# Patient Record
Sex: Male | Born: 1995 | Race: Black or African American | Hispanic: No | Marital: Single | Smoking: Never smoker
Health system: Southern US, Community
[De-identification: ages and names within clinical notes are randomized; demographics above are authoritative.]

## PROBLEM LIST (undated history)

## (undated) DIAGNOSIS — K259 Gastric ulcer, unspecified as acute or chronic, without hemorrhage or perforation: Secondary | ICD-10-CM

## (undated) DIAGNOSIS — R51 Headache: Secondary | ICD-10-CM

## (undated) DIAGNOSIS — F1910 Other psychoactive substance abuse, uncomplicated: Secondary | ICD-10-CM

## (undated) HISTORY — PX: CRANIOTOMY: SHX93

## (undated) HISTORY — DX: Headache: R51

---
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
## 2017-01-31 ENCOUNTER — Encounter: Payer: Self-pay | Admitting: Emergency Medicine

## 2017-06-17 IMAGING — US US RENAL
1 series · 14 of 25 positions shown · non-contrast
Comparison: CT of the abdomen and pelvis performed 06/29/2014

CLINICAL DATA: Acute onset of left flank pain for 2 days. Initial
encounter.

EXAM:
RENAL / URINARY TRACT ULTRASOUND COMPLETE

[Series 1: us renal · 0.21mm/px · 14 of 50 slices shown]
[im 1/50]
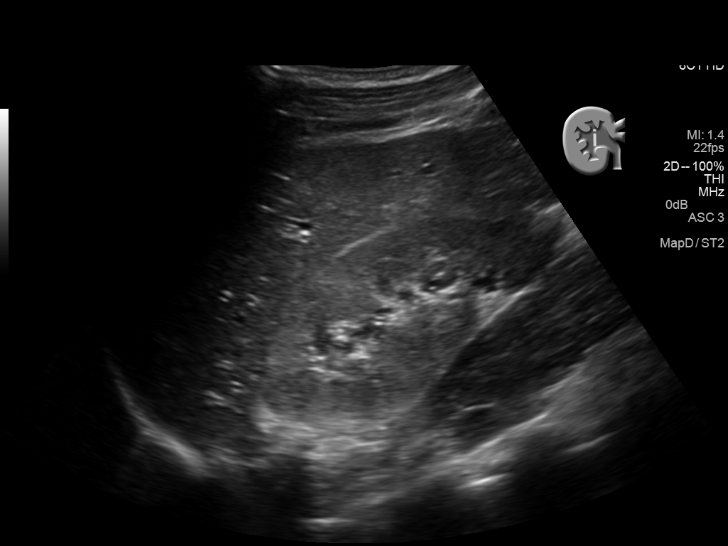
[im 5/50]
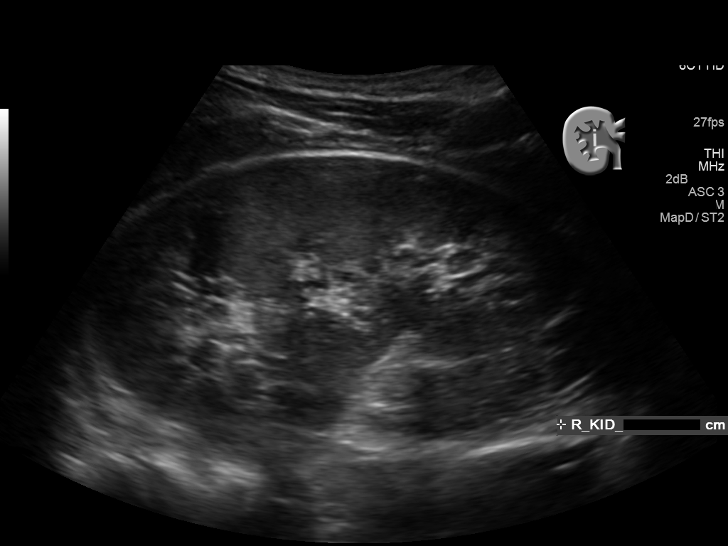
[im 9/50]
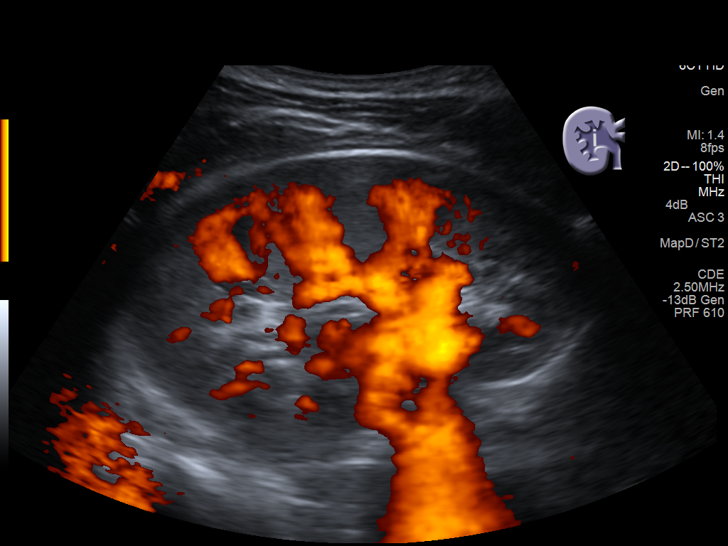
[im 13/50]
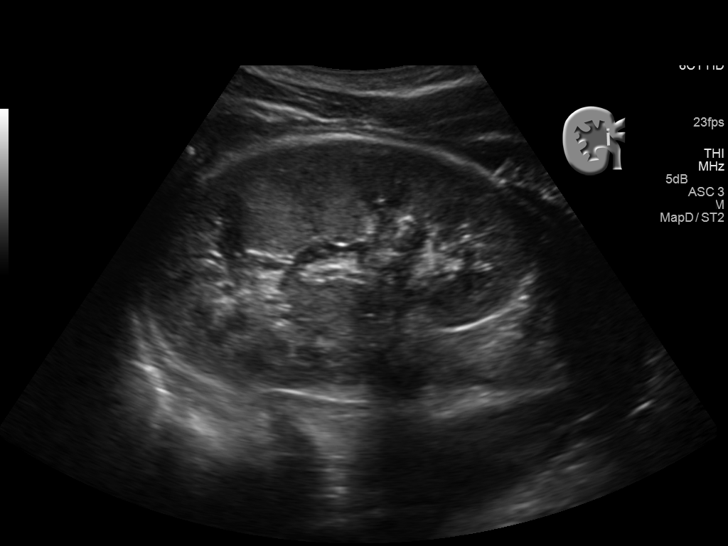
[im 17/50]
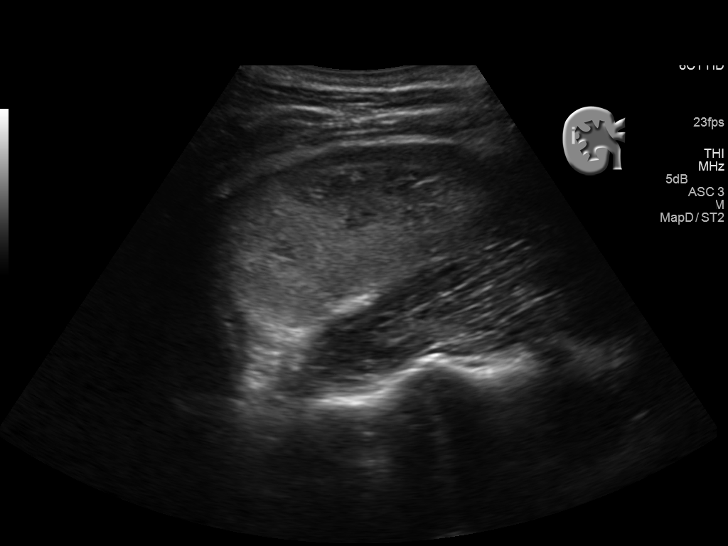
[im 19/50]
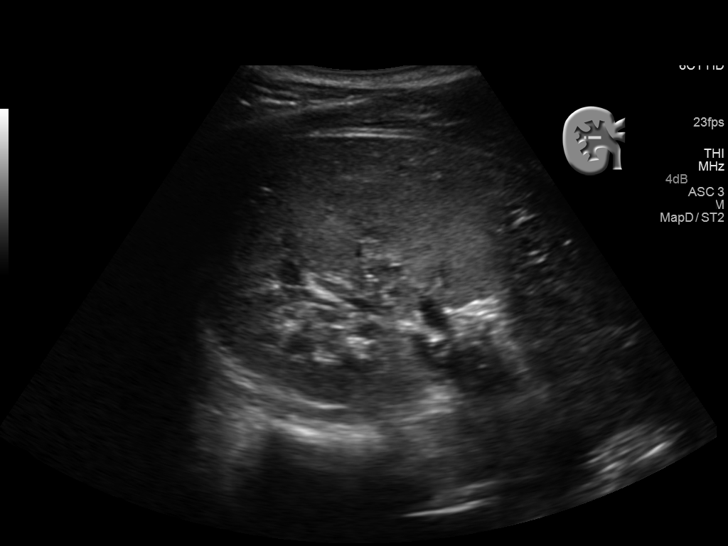
[im 23/50]
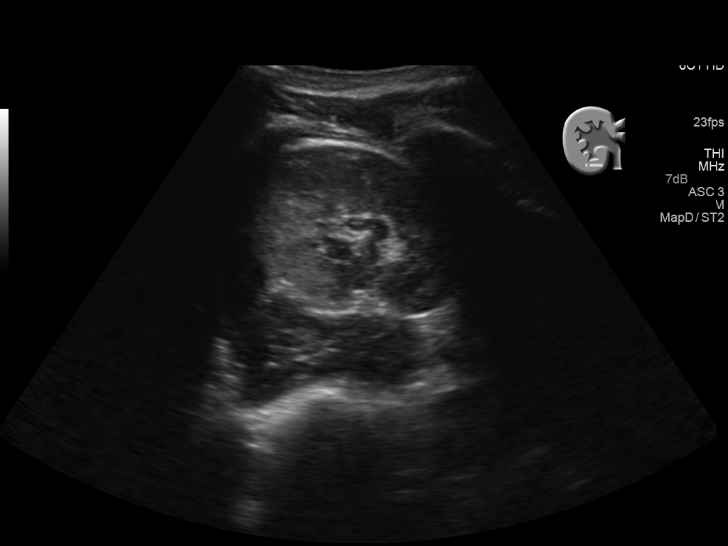
[im 27/50]
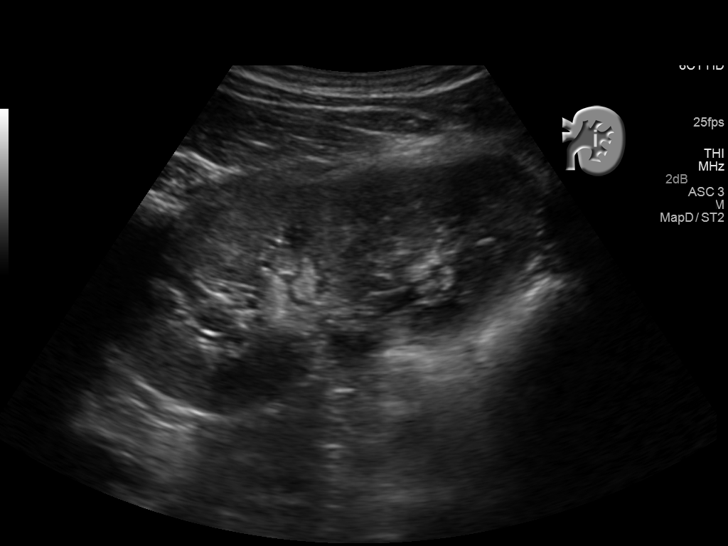
[im 31/50]
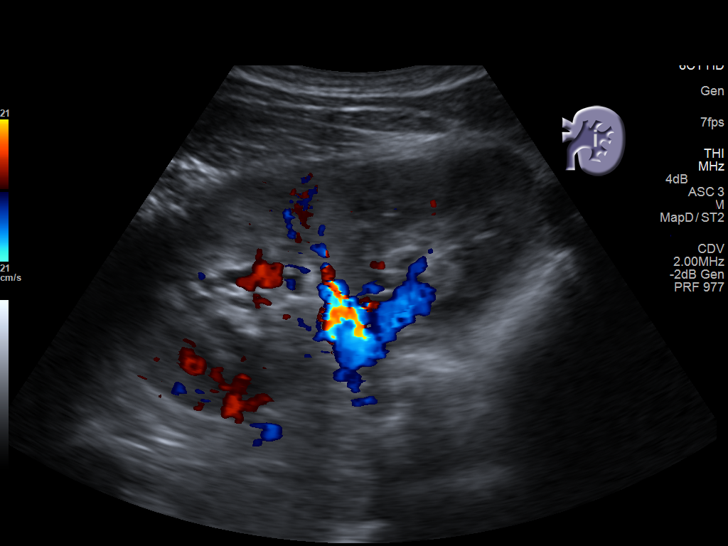
[im 33/50]
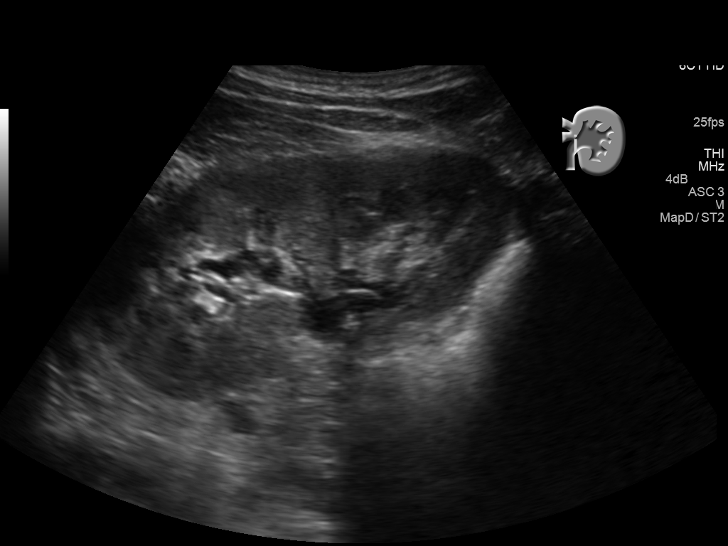
[im 37/50]
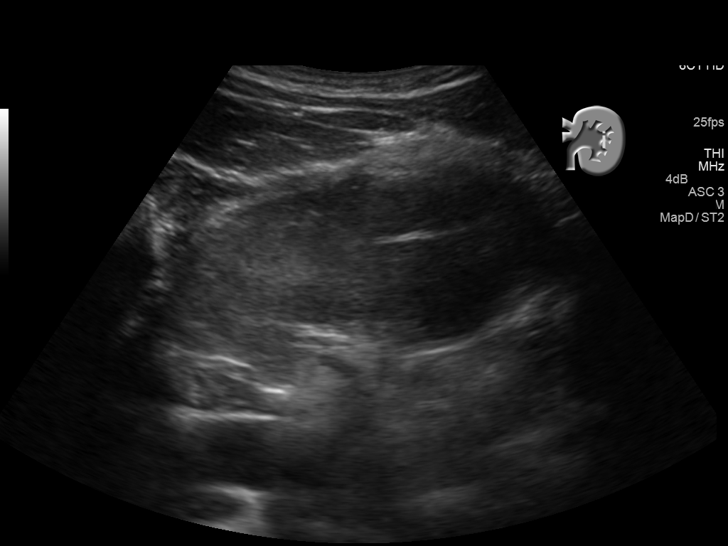
[im 41/50]
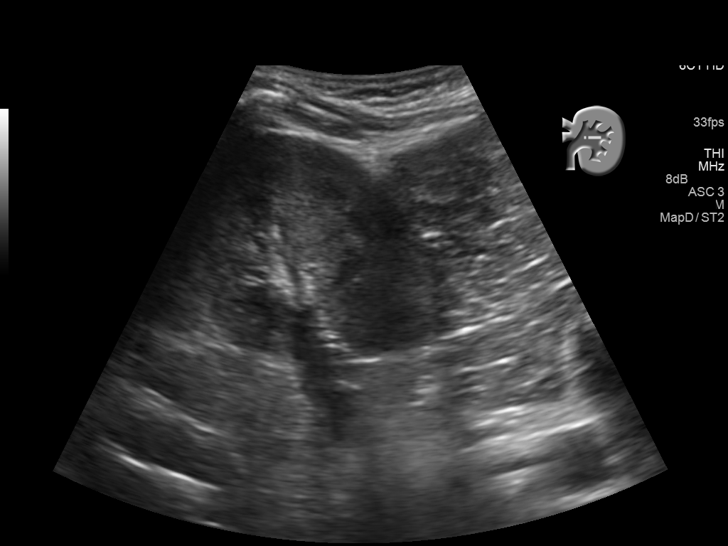
[im 45/50]
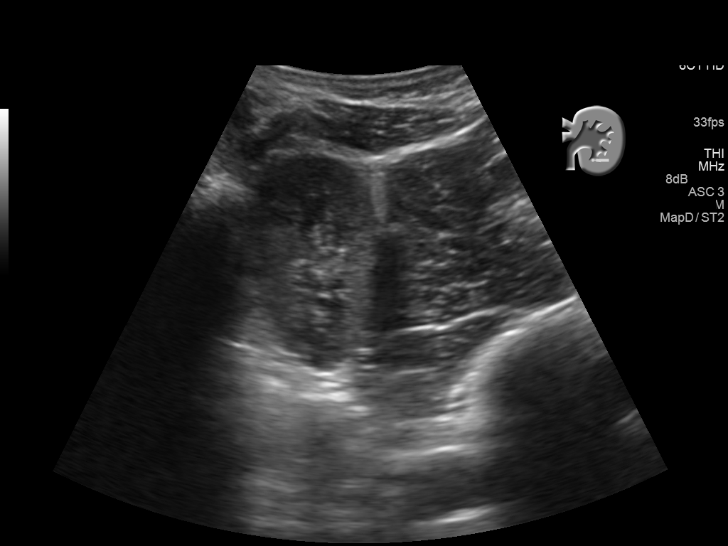
[im 50/50]
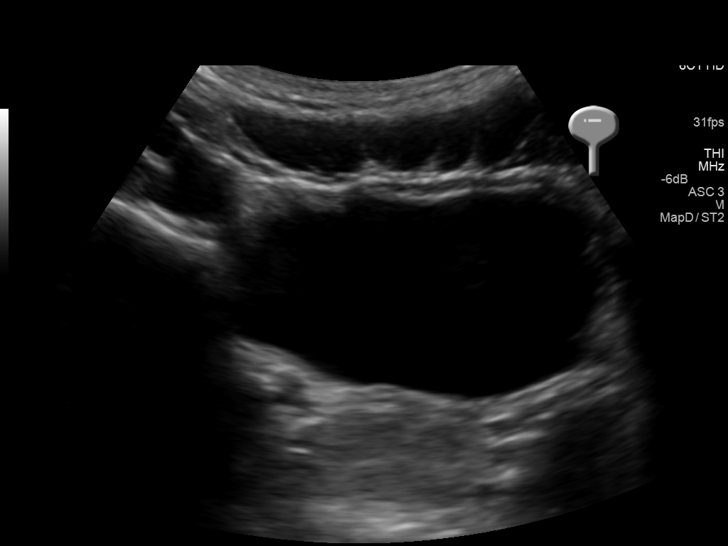

[14 of 25 positions shown; findings below may reference images not displayed]

FINDINGS: Right Kidney:

Length: 11.4 cm. Echogenicity within normal limits. No mass or
hydronephrosis visualized.

Left Kidney:

Length: 11.6 cm. Echogenicity within normal limits. No mass or
hydronephrosis visualized.

Bladder:

Mildly distended and grossly unremarkable in appearance.
IMPRESSION: Unremarkable renal ultrasound.  No evidence for hydronephrosis.

## 2017-08-06 ENCOUNTER — Emergency Department: Payer: Medicaid Other

## 2017-08-06 DIAGNOSIS — Y9367 Activity, basketball: Secondary | ICD-10-CM | POA: Insufficient documentation

## 2017-08-06 DIAGNOSIS — S93492A Sprain of other ligament of left ankle, initial encounter: Secondary | ICD-10-CM | POA: Diagnosis not present

## 2017-08-06 DIAGNOSIS — Y92310 Basketball court as the place of occurrence of the external cause: Secondary | ICD-10-CM | POA: Insufficient documentation

## 2017-08-06 DIAGNOSIS — Y998 Other external cause status: Secondary | ICD-10-CM | POA: Insufficient documentation

## 2017-08-06 DIAGNOSIS — Y33XXXA Other specified events, undetermined intent, initial encounter: Secondary | ICD-10-CM | POA: Insufficient documentation

## 2017-08-06 DIAGNOSIS — S99912A Unspecified injury of left ankle, initial encounter: Secondary | ICD-10-CM | POA: Diagnosis present

## 2017-08-06 NOTE — ED Triage Notes (Signed)
Patient reports "rolled" ankle while playing basketball yesterday and having continued pain today.

## 2017-08-07 ENCOUNTER — Emergency Department
Admission: EM | Admit: 2017-08-07 | Discharge: 2017-08-07 | Disposition: A | Discharge status: Home / Self Care | Payer: Medicaid Other | Attending: Emergency Medicine | Admitting: Emergency Medicine

## 2017-08-07 DIAGNOSIS — S93492A Sprain of other ligament of left ankle, initial encounter: Secondary | ICD-10-CM

## 2017-08-07 MED ORDER — PREDNISONE 10 MG PO TABS
10.0000 mg | ORAL_TABLET | Freq: Every day | ORAL | 0 refills | Status: DC
Start: 2017-08-07 — End: 2020-05-07

## 2017-08-07 NOTE — ED Provider Notes (Signed)
[facility] Emergency Department Provider Note  ____________________________________________  Time seen: Approximately 12:19 AM  I have reviewed the triage vital signs and the nursing notes.   HISTORY  Chief Complaint Ankle Pain    HPI Stephen Ford is a 21 y.o. male presents emergency department complaining of lateral ankle pain to the left ankle. Patient reports that he was playing basketball, went up for shot, landed awkwardly on his left ankle causing an inversion injury.patient is having pain to the lateral ankle with swelling. Patient has been using a friend's crutches for ambulation. No other injury or complaint. Patient has not taken any medications prior to arrival. Patient has a history of gastric ulcer and is unable to take NSAIDs.    Past Medical History:  Diagnosis Date  . Gastric ulcer   . Headache(784.0)   . Stomach ulcer     Patient Active Problem List   Diagnosis Date Noted  . Transient alteration of awareness 03/12/2013  . Migraine without aura, without mention of intractable migraine without mention of status migrainosus 03/12/2013  . Episodic tension type headache 03/12/2013  . Concussion with moderate (1-24 hours) loss of consciousness 03/12/2013    Past Surgical History:  Procedure Laterality Date  . CRANIOTOMY      Prior to Admission medications   Medication Sig Start Date End Date Taking? Authorizing Provider  cyclobenzaprine (FLEXERIL) 10 MG tablet Take 1 tablet (10 mg total) by mouth 3 (three) times daily as needed. 08/26/16   Joni Reining, PA-C  fluticasone (FLONASE) 50 MCG/ACT nasal spray Place 2 sprays into both nostrils daily. 03/28/16   Joselyn Arrow, NP  predniSONE (DELTASONE) 10 MG tablet Take 1 tablet (10 mg total) by mouth daily. 08/07/17   Cuthriell, Delorise Royals, PA-C  traMADol (ULTRAM) 50 MG tablet Take 1 tablet (50 mg total) by mouth every 12 (twelve) hours as needed. 08/26/16   Joni Reining, PA-C     Allergies Patient has no known allergies.  Family History  Problem Relation Age of Onset  . Other Other        Maternal Great Aunt had Down's Syndrome  . Blindness Other        Maternal Great Grandmother    Social History Social History  Substance Use Topics  . Smoking status: Never Smoker  . Smokeless tobacco: Never Used  . Alcohol use No     Review of Systems  Constitutional: No fever/chills Cardiovascular: no chest pain. Respiratory: no cough. No SOB. Gastrointestinal: No abdominal pain.  No nausea, no vomiting.   Musculoskeletal: positive for left ankle injury Skin: Negative for rash, abrasions, lacerations, ecchymosis. Neurological: Negative for headaches, focal weakness or numbness. 10-point ROS otherwise negative.  ____________________________________________   PHYSICAL EXAM:  VITAL SIGNS: ED Triage Vitals  Enc Vitals Group     BP 08/06/17 2337 (!) 143/75     Pulse Rate 08/06/17 2337 83     Resp 08/06/17 2337 20     Temp 08/06/17 2337 98.4 F (36.9 C)     Temp Source 08/06/17 2337 Oral     SpO2 08/06/17 2337 100 %     Weight 08/06/17 2315 205 lb (93 kg)     Height 08/06/17 2315  (1.854 m)     [facility] 08/06/17 2315 9     Pain Loc --      Pain Edu? --  Excl. in GC? --      Constitutional: Alert and oriented. Well appearing and in no acute distress. Eyes: Conjunctivae are normal. PERRL. EOMI. Head: Atraumatic. Neck: No stridor.    Cardiovascular: Normal rate, regular rhythm. Normal S1 and S2.  Good peripheral circulation. Respiratory: Normal respiratory effort without tachypnea or retractions. Lungs CTAB. Good air entry to the bases with no decreased or absent breath sounds. Musculoskeletal: Full range of motion to all extremities. No gross deformities appreciated.patient has edema noted to the lateral aspect of the left ankle. No deformity. Full range of motion with coaxing. Patient is  tender to palpation over the lateral malleolus and the anterior talofibular ligament distribution. No palpable abnormality. Dorsalis pedis pulse intact. Sensation intact all 5 digits. Examination of the left knee is unremarkable. Neurologic:  Normal speech and language. No gross focal neurologic deficits are appreciated.  Skin:  Skin is warm, dry and intact. No rash noted. Psychiatric: Mood and affect are normal. Speech and behavior are normal. Patient exhibits appropriate insight and judgement.   ____________________________________________   LABS (all labs ordered are listed, but only abnormal results are displayed)  Labs Reviewed - No data to display ____________________________________________  EKG   ____________________________________________  RADIOLOGY Festus Barren Cuthriell, personally viewed and evaluated these images (plain radiographs) as part of my medical decision making, as well as reviewing the written report by the radiologist.  Dg Ankle Complete Left  Result Date: 08/06/2017 CLINICAL DATA:  Basketball injury yesterday with continued pain today. EXAM: LEFT ANKLE COMPLETE - 3+ VIEW COMPARISON:  None. FINDINGS: Lateral malleolar soft tissue swelling. No evidence of acute fracture. Old unfused fragment at the tip of the medial malleolus. Mortise is symmetric. IMPRESSION: Negative for acute fracture Electronically Signed   By: Ellery Plunk M.D.   On: 08/06/2017 23:34    ____________________________________________    PROCEDURES  Procedure(s) performed:    Procedures    Medications - No data to display   ____________________________________________   INITIAL IMPRESSION / ASSESSMENT AND PLAN / ED COURSE  Pertinent labs & imaging results that were available during my care of the patient were reviewed by me and considered in my medical decision making (see chart for details).  Review of the [place] CSRS was performed in accordance of the [facility] prior to  dispensing any controlled drugs.     Patient's diagnosis is consistent with left ankle sprain. Differential included sprain versus fracture versus ligament rupture. Exam is most consistent with ankle sprain is patient has a negative x-ray for acute osseous abnormality. No indication of acute ligamentous rupture.. Patient will be discharged home with prescriptions for prednisone taper as patient is unable to take NSAIDs. Patient is to follow up with family care or orthopedics as needed or otherwise directed. Patient is given ED precautions to return to the ED for any worsening or new symptoms.     ____________________________________________  FINAL CLINICAL IMPRESSION(S) / ED DIAGNOSES  Final diagnoses:  Sprain of anterior talofibular ligament of left ankle, initial encounter      NEW MEDICATIONS STARTED DURING THIS VISIT:  New Prescriptions   PREDNISONE (DELTASONE) 10 MG TABLET    Take 1 tablet (10 mg total) by mouth daily.        This chart was dictated using voice recognition software/Dragon. Despite best efforts to proofread, errors can occur which can change the meaning. Any change was purely unintentional.    Racheal Patches, PA-C 08/07/17 0028    Jeanmarie Plant, MD 08/07/17  [id number]  

## 2020-05-07 ENCOUNTER — Emergency Department
Admission: EM | Admit: 2020-05-07 | Discharge: 2020-05-07 | Disposition: A | Discharge status: Home / Self Care | Payer: Self-pay | Attending: Emergency Medicine | Admitting: Emergency Medicine

## 2020-05-07 ENCOUNTER — Encounter: Payer: Self-pay | Admitting: Emergency Medicine

## 2020-05-07 ENCOUNTER — Other Ambulatory Visit: Payer: Self-pay

## 2020-05-07 ENCOUNTER — Emergency Department: Payer: Self-pay

## 2020-05-07 DIAGNOSIS — Y92310 Basketball court as the place of occurrence of the external cause: Secondary | ICD-10-CM | POA: Insufficient documentation

## 2020-05-07 DIAGNOSIS — S20212A Contusion of left front wall of thorax, initial encounter: Secondary | ICD-10-CM | POA: Insufficient documentation

## 2020-05-07 DIAGNOSIS — Y9367 Activity, basketball: Secondary | ICD-10-CM | POA: Insufficient documentation

## 2020-05-07 DIAGNOSIS — Y998 Other external cause status: Secondary | ICD-10-CM | POA: Insufficient documentation

## 2020-05-07 DIAGNOSIS — W500XXA Accidental hit or strike by another person, initial encounter: Secondary | ICD-10-CM | POA: Insufficient documentation

## 2020-05-07 MED ORDER — MELOXICAM 7.5 MG PO TABS
15.0000 mg | ORAL_TABLET | Freq: Once | ORAL | Status: AC
  Administered 2020-05-07: 15 mg via ORAL
  Filled 2020-05-07: qty 2

## 2020-05-07 MED ORDER — MELOXICAM 15 MG PO TABS: 15.0000 mg | ORAL_TABLET | Freq: Every day | ORAL | 0 refills | Status: DC

## 2020-05-07 NOTE — ED Notes (Signed)
See triage note  Presents with pain to left lateral rib area  States he was kneed to left rib rib area couple of days ago while playing b/b

## 2020-05-07 NOTE — ED Triage Notes (Signed)
Patient reports playing basketball 2 days ago and getting hit in the left side with a knee and now having worsening pain in left ribs.

## 2020-05-07 NOTE — ED Provider Notes (Signed)
[facility] Emergency Department Provider Note  ____________________________________________  Time seen: Approximately 3:17 PM  I have reviewed the triage vital signs and the nursing notes.   HISTORY  Chief Complaint Rib Injury    HPI Stephen Ford is a 24 y.o. male who presents the emergency department complaining of left rib pain.  Patient states he was playing basketball a couple of days ago, jumped up for a rebound when another player that same.  Patient states that the other players knee made contact with his left ribs.  Patient has had left rib pain since.  Patient denies any shortness of breath or chest pain.  No abdominal pain.  No nausea or vomiting.  No other complaints or injuries at this time.         Past Medical History:  Diagnosis Date  . Gastric ulcer   . Headache(784.0)   . Stomach ulcer     Patient Active Problem List   Diagnosis Date Noted  . Transient alteration of awareness 03/12/2013  . Migraine without aura, without mention of intractable migraine without mention of status migrainosus 03/12/2013  . Episodic tension type headache 03/12/2013  . Concussion with moderate (1-24 hours) loss of consciousness 03/12/2013    Past Surgical History:  Procedure Laterality Date  . CRANIOTOMY      Prior to Admission medications   Medication Sig Start Date End Date Taking? Authorizing Provider  meloxicam (MOBIC) 15 MG tablet Take 1 tablet (15 mg total) by mouth daily. 05/07/20   Tinzley Dalia, Delorise Royals, PA-C    Allergies Patient has no known allergies.  Family History  Problem Relation Age of Onset  . Other Other        Maternal Great Aunt had Down's Syndrome  . Blindness Other        Maternal Great Grandmother    Social History Social History   Tobacco Use  . Smoking status: Never Smoker  . Smokeless tobacco: Never Used  Substance Use Topics  . Alcohol use: No  . Drug use: No     Review of Systems  Constitutional: No  fever/chills Eyes: No visual changes. No discharge ENT: No upper respiratory complaints. Cardiovascular: no chest pain. Respiratory: no cough. No SOB. Gastrointestinal: No abdominal pain.  No nausea, no vomiting.  No diarrhea.  No constipation. Musculoskeletal: Left rib pain Skin: Negative for rash, abrasions, lacerations, ecchymosis. Neurological: Negative for headaches, focal weakness or numbness. 10-point ROS otherwise negative.  ____________________________________________   PHYSICAL EXAM:  VITAL SIGNS: ED Triage Vitals  Enc Vitals Group     BP 05/07/20 1236 116/74     Pulse Rate 05/07/20 1236 76     Resp 05/07/20 1236 20     Temp 05/07/20 1236 98.7 F (37.1 C)     Temp Source 05/07/20 1236 Oral     SpO2 05/07/20 1236 97 %     Weight 05/07/20 1219 205 lb 0.4 oz (93 kg)     Height 05/07/20 1219 6\' 1"  (1.854 m)     Head Circumference --      Peak Flow --      Pain Score 05/07/20 1219 7     Pain Loc --      Pain Edu? --      Excl. in GC? --      Constitutional: Alert and oriented. Well appearing and in no acute distress. Eyes: Conjunctivae are normal. PERRL. EOMI. Head: Atraumatic. ENT:      Ears:  Nose: No congestion/rhinnorhea.      Mouth/Throat: Mucous membranes are moist.  Neck: No stridor.    Cardiovascular: Normal rate, regular rhythm. Normal S1 and S2.  Good peripheral circulation. Respiratory: Normal respiratory effort without tachypnea or retractions. Lungs CTAB. Good air entry to the bases with no decreased or absent breath sounds. Gastrointestinal: Bowel sounds 4 quadrants. Soft and nontender to palpation. No guarding or rigidity. No palpable masses. No distention. No CVA enderness. Musculoskeletal: Full range of motion to all extremities. No gross deformities appreciated.  Visualization of the left ribs reveals no deformity, paradoxical chest wall movement, abrasions or lacerations.  Patient is tender to palpation over ribs 10 through 12 left lower  lateral ribs.  No palpable abnormality.  No crepitus.  No subcutaneous emphysema.  Good underlying breath sounds bilaterally.  No other tenderness to palpation. Neurologic:  Normal speech and language. No gross focal neurologic deficits are appreciated.  Skin:  Skin is warm, dry and intact. No rash noted. Psychiatric: Mood and affect are normal. Speech and behavior are normal. Patient exhibits appropriate insight and judgement.   ____________________________________________   LABS (all labs ordered are listed, but only abnormal results are displayed)  Labs Reviewed - No data to display ____________________________________________  EKG   ____________________________________________  RADIOLOGY I personally viewed and evaluated these images as part of my medical decision making, as well as reviewing the written report by the radiologist.  DG Ribs Unilateral W/Chest Left  Result Date: 05/07/2020 CLINICAL DATA:  Hit in left side 2 days ago playing basketball. Left rib pain. EXAM: LEFT RIBS AND CHEST - 3+ VIEW COMPARISON:  None. FINDINGS: No fracture or other bone lesions are seen involving the ribs. There is no evidence of pneumothorax or pleural effusion. Both lungs are clear. Heart size and mediastinal contours are within normal limits. IMPRESSION: Negative. Electronically Signed   By: Charlett Nose M.D.   On: 05/07/2020 12:47    ____________________________________________    PROCEDURES  Procedure(s) performed:    Procedures    Medications  meloxicam (MOBIC) tablet 15 mg (15 mg Oral Given 05/07/20 1527)     ____________________________________________   INITIAL IMPRESSION / ASSESSMENT AND PLAN / ED COURSE  Pertinent labs & imaging results that were available during my care of the patient were reviewed by me and considered in my medical decision making (see chart for details).  Review of the [place] CSRS was performed in accordance of the [facility] prior to dispensing any  controlled drugs.           Patient's diagnosis is consistent with rib contusion.  Patient presented to emergency department after being accidentally kneed in the chest while playing basketball.  Patient is having ongoing pain to the left ribs.  No difficulty breathing..  Imaging reveals no acute rib fractures or pneumothorax.  Differential included rib contusion, fractured rib, pneumothorax.  Meloxicam for symptom improvement.  Follow-up primary care as needed.  Patient is given ED precautions to return to the ED for any worsening or new symptoms.     ____________________________________________  FINAL CLINICAL IMPRESSION(S) / ED DIAGNOSES  Final diagnoses:  Contusion of rib on left side, initial encounter      NEW MEDICATIONS STARTED DURING THIS VISIT:  ED Discharge Orders         Ordered    meloxicam (MOBIC) 15 MG tablet  Daily     Discontinue  Reprint     05/07/20 1520  This chart was dictated using voice recognition software/Dragon. Despite best efforts to proofread, errors can occur which can change the meaning. Any change was purely unintentional.    Racheal Patches, PA-C 05/07/20 1529    Dionne Bucy, MD 05/07/20 1539

## 2021-05-30 ENCOUNTER — Emergency Department
Admission: EM | Admit: 2021-05-30 | Discharge: 2021-05-30 | Disposition: A | Discharge status: Home / Self Care | Payer: Self-pay | Attending: Emergency Medicine | Admitting: Emergency Medicine

## 2021-05-30 ENCOUNTER — Emergency Department: Payer: Self-pay

## 2021-05-30 ENCOUNTER — Other Ambulatory Visit: Payer: Self-pay

## 2021-05-30 DIAGNOSIS — J209 Acute bronchitis, unspecified: Secondary | ICD-10-CM | POA: Insufficient documentation

## 2021-05-30 DIAGNOSIS — Z20822 Contact with and (suspected) exposure to covid-19: Secondary | ICD-10-CM | POA: Insufficient documentation

## 2021-05-30 DIAGNOSIS — Z28310 Unvaccinated for covid-19: Secondary | ICD-10-CM | POA: Insufficient documentation

## 2021-05-30 LAB — RESP PANEL BY RT-PCR (FLU A&B, COVID) ARPGX2
Influenza A by PCR: NEGATIVE
Influenza B by PCR: NEGATIVE
SARS Coronavirus 2 by RT PCR: NEGATIVE

## 2021-05-30 MED ORDER — AZITHROMYCIN 250 MG PO TABS: ORAL_TABLET | ORAL | 0 refills | Status: DC

## 2021-05-30 MED ORDER — BENZONATATE 100 MG PO CAPS: 100.0000 mg | ORAL_CAPSULE | Freq: Three times a day (TID) | ORAL | 0 refills | Status: AC | PRN

## 2021-05-30 NOTE — Discharge Instructions (Addendum)
Follow-up with your regular doctor if not improving in 2 to 3 days.  Return emergency department for worsening Take medications as prescribed.  Also take over-the-counter Mucinex

## 2021-05-30 NOTE — ED Triage Notes (Signed)
Pt states that he has had a cough, congestion, sore throat and felt like he's had a cold x1 week- pt states that he has been coughing up mucous and this morning noticed some blood in the mucous

## 2021-05-30 NOTE — ED Notes (Signed)
Pt presents to the ED for weakness, fatigue, and productive cough for the past 2 weeks. Unknown if he has had a fever. Has not tested himself for COVID. Pt states he does have some SOB on exertion. Pt states he noticed some blood in his sputum last night. Pt is A&Ox4 and NAD.

## 2021-05-30 NOTE — ED Provider Notes (Signed)
[facility] Emergency Department Provider Note  ____________________________________________   Event Date/Time   First MD Initiated Contact with Patient 05/30/21 1122     (approximate)  I have reviewed the triage vital signs and the nursing notes.   HISTORY  Chief Complaint Cough    HPI Stephen Ford is a 25 y.o. male presents emergency department complaining of a cough for 2 weeks.  Patient denies fever or chills.  States cough is productive and today had some red streaks of blood in the mucus.  He denies chest pain or shortness of breath.  Patient was not vaccinated for COVID.  No recent COVID exposure that he knows of.  Past Medical History:  Diagnosis Date   Gastric ulcer    Headache(784.0)    Stomach ulcer     Patient Active Problem List   Diagnosis Date Noted   Transient alteration of awareness 03/12/2013   Migraine without aura, without mention of intractable migraine without mention of status migrainosus 03/12/2013   Episodic tension type headache 03/12/2013   Concussion with moderate (1-24 hours) loss of consciousness 03/12/2013    Past Surgical History:  Procedure Laterality Date   CRANIOTOMY      Prior to Admission medications   Medication Sig Start Date End Date Taking? Authorizing Provider  azithromycin (ZITHROMAX Z-PAK) 250 MG tablet 2 pills today then 1 pill a day for 4 days 05/30/21  Yes Taeler Winning, Roselyn Bering, PA-C  benzonatate (TESSALON PERLES) 100 MG capsule Take 1 capsule (100 mg total) by mouth 3 (three) times daily as needed for cough. 05/30/21 05/30/22 Yes Anelia Carriveau, Roselyn Bering, PA-C  meloxicam (MOBIC) 15 MG tablet Take 1 tablet (15 mg total) by mouth daily. 05/07/20   Cuthriell, Delorise Royals, PA-C    Allergies Patient has no known allergies.  Family History  Problem Relation Age of Onset   Other Other        Maternal Great Aunt had Down's Syndrome   Blindness Other        Maternal Programme researcher, broadcasting/film/video    Social History Social  History   Tobacco Use   Smoking status: Never   Smokeless tobacco: Never  Substance Use Topics   Alcohol use: No   Drug use: No    Review of Systems  Constitutional: No fever/chills Eyes: No visual changes. ENT: No sore throat. Respiratory: Positive cough Cardiovascular: Denies chest pain Gastrointestinal: Denies abdominal pain Genitourinary: Negative for dysuria. Musculoskeletal: Negative for back pain. Skin: Negative for rash. Psychiatric: no mood changes,     ____________________________________________   PHYSICAL EXAM:  VITAL SIGNS: ED Triage Vitals  Enc Vitals Group     BP 05/30/21 1023 118/63     Pulse Rate 05/30/21 1023 62     Resp 05/30/21 1023 18     Temp 05/30/21 1023 98.1 F (36.7 C)     Temp Source 05/30/21 1023 Oral     SpO2 05/30/21 1023 94 %     Weight 05/30/21 1020 177 lb (80.3 kg)     Height 05/30/21 1020 6\' 1"  (1.854 m)     Head Circumference --      Peak Flow --      Pain Score 05/30/21 1020 5     Pain Loc --      Pain Edu? --      Excl. in GC? --     Constitutional: Alert and oriented. Well appearing and in no acute distress. Eyes: Conjunctivae are normal.  Head: Atraumatic. Nose:  No congestion/rhinnorhea. Mouth/Throat: Mucous membranes are moist.   Neck:  supple no lymphadenopathy noted Cardiovascular: Normal rate, regular rhythm. Heart sounds are normal Respiratory: Normal respiratory effort.  No retractions, lungs c t a  GU: deferred Musculoskeletal: FROM all extremities, warm and well perfused Neurologic:  Normal speech and language.  Skin:  Skin is warm, dry and intact. No rash noted. Psychiatric: Mood and affect are normal. Speech and behavior are normal.  ____________________________________________   LABS (all labs ordered are listed, but only abnormal results are displayed)  Labs Reviewed  RESP PANEL BY RT-PCR (FLU A&B, COVID) [id number]    ____________________________________________   ____________________________________________  RADIOLOGY  Chest x-ray  ____________________________________________   PROCEDURES  Procedure(s) performed: No  Procedures    ____________________________________________   INITIAL IMPRESSION / ASSESSMENT AND PLAN / ED COURSE  Pertinent labs & imaging results that were available during my care of the patient were reviewed by me and considered in my medical decision making (see chart for details).   Patient is a 25 year old male presents emergency department with URI symptoms.  See HPI.  Physical exam shows patient per stable.  Chest x-ray reviewed by me confirmed by radiology to be negative for any acute abnormality  Covid/flu test is negative  I did explain the findings to the patient.  He is to follow-up with his regular doctor if not improving in 2 to 3 days.  Return emergency department worsening.  Scalp prescription for Z-Pak and Tessalon Perles.  Take over-the-counter Mucinex.  Was given a work note and discharged stable condition.     Stephen Ford was evaluated in Emergency Department on 05/30/2021 for the symptoms described in the history of present illness. He was evaluated in the context of the global COVID-19 pandemic, which necessitated consideration that the patient might be at risk for infection with the SARS-CoV-2 virus that causes COVID-19. Institutional protocols and algorithms that pertain to the evaluation of patients at risk for COVID-19 are in a state of rapid change based on information released by regulatory bodies including the CDC and federal and state organizations. These policies and algorithms were followed during the patient's care in the ED.    As part of my medical decision making, I reviewed the following data within the electronic MEDICAL RECORD NUMBER Nursing notes reviewed and incorporated, Labs reviewed , Old chart reviewed, Radiograph reviewed , Notes  from prior ED visits, and [place] Controlled Substance Database  ____________________________________________   FINAL CLINICAL IMPRESSION(S) / ED DIAGNOSES  Final diagnoses:  Acute bronchitis, unspecified organism      NEW MEDICATIONS STARTED DURING THIS VISIT:  New Prescriptions   AZITHROMYCIN (ZITHROMAX Z-PAK) 250 MG TABLET    2 pills today then 1 pill a day for 4 days   BENZONATATE (TESSALON PERLES) 100 MG CAPSULE    Take 1 capsule (100 mg total) by mouth 3 (three) times daily as needed for cough.     Note:  This document was prepared using Dragon voice recognition software and may include unintentional dictation errors.    Faythe Ghee, PA-C 05/30/21 1310    Georga Hacking, MD 05/30/21 1344

## 2021-12-25 ENCOUNTER — Emergency Department
Admission: EM | Admit: 2021-12-25 | Discharge: 2021-12-25 | Disposition: A | Discharge status: Home / Self Care | Payer: Self-pay | Attending: Emergency Medicine | Admitting: Emergency Medicine

## 2021-12-25 ENCOUNTER — Emergency Department: Payer: Self-pay

## 2021-12-25 ENCOUNTER — Other Ambulatory Visit: Payer: Self-pay

## 2021-12-25 DIAGNOSIS — R0600 Dyspnea, unspecified: Secondary | ICD-10-CM | POA: Insufficient documentation

## 2021-12-25 DIAGNOSIS — R55 Syncope and collapse: Secondary | ICD-10-CM | POA: Insufficient documentation

## 2021-12-25 DIAGNOSIS — R42 Dizziness and giddiness: Secondary | ICD-10-CM | POA: Insufficient documentation

## 2021-12-25 LAB — URINALYSIS, ROUTINE W REFLEX MICROSCOPIC
Bilirubin Urine: NEGATIVE
Glucose, UA: NEGATIVE mg/dL
Hgb urine dipstick: NEGATIVE
Ketones, ur: NEGATIVE mg/dL
Leukocytes,Ua: NEGATIVE
Nitrite: NEGATIVE
Protein, ur: NEGATIVE mg/dL
Specific Gravity, Urine: >1.046 — ABNORMAL HIGH (ref 1.005–1.030)
pH: 6 (ref 5.0–8.0)

## 2021-12-25 LAB — BASIC METABOLIC PANEL
Anion gap: 9 (ref 5–15)
BUN: 11 mg/dL (ref 6–20)
CO2: 24 mmol/L (ref 22–32)
Calcium: 9.8 mg/dL (ref 8.9–10.3)
Chloride: 106 mmol/L (ref 98–111)
Creatinine, Ser: 0.96 mg/dL (ref 0.61–1.24)
GFR, Estimated: >60 mL/min (ref >60)
Glucose, Bld: 101 mg/dL — ABNORMAL HIGH (ref 70–99)
Potassium: 4.2 mmol/L (ref 3.5–5.1)
Sodium: 139 mmol/L (ref 135–145)

## 2021-12-25 LAB — CBC
HCT: 43.1 % (ref 39.0–52.0)
Hemoglobin: 14.1 g/dL (ref 13.0–17.0)
MCH: 25.3 pg — ABNORMAL LOW (ref 26.0–34.0)
MCHC: 32.7 g/dL (ref 30.0–36.0)
MCV: 77.4 fL — ABNORMAL LOW (ref 80.0–100.0)
Platelets: 348 10*3/uL (ref 150–400)
RBC: 5.57 MIL/uL (ref 4.22–5.81)
RDW: 14.6 % (ref 11.5–15.5)
WBC: 3.8 10*3/uL — ABNORMAL LOW (ref 4.0–10.5)
nRBC: 0 % (ref 0.0–0.2)

## 2021-12-25 MED ORDER — KETOROLAC TROMETHAMINE 30 MG/ML IJ SOLN
15.0000 mg | INTRAMUSCULAR | Status: AC
Start: 2021-12-25 — End: 2021-12-25
  Administered 2021-12-25: 15 mg via INTRAVENOUS
  Filled 2021-12-25: qty 1

## 2021-12-25 MED ORDER — IOHEXOL 350 MG/ML SOLN
75.0000 mL | Freq: Once | INTRAVENOUS | Status: AC | PRN
  Administered 2021-12-25: 75 mL via INTRAVENOUS

## 2021-12-25 NOTE — ED Triage Notes (Addendum)
Patient to ER via Pov with friends. Patient reports he woke up this morning to get ready around 12:50, states the next thing he remembers was waking up on the floor on his stomach. Unsure if he hit his head but reports his neck hurts. Reports he woke up around 1315 and texted his friend. States he still feels dizzy, has a headache and is having difficulty thinking. Reports for several weeks he has had difficulty focusing and thinking and had a poor appetite. ? ?No history of the same. Reports having the flu two weeks ago.  ? ?Denies blood thinner usage.  ?
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
## 2021-12-25 NOTE — ED Provider Notes (Signed)
? ?[facility] ?Provider Note ? ? ? Event Date/Time  ? First MD Initiated Contact with Patient 12/25/21 1957   ?  (approximate) ? ? ?History  ? ?Headache and Loss of Consciousness ? ? ?HPI ? ?Stephen Ford is a 26 y.o. male with no significant past medical history who comes ED due to syncope today.  He reports being in his usual state of health, got up this morning, and then subsequently passed out while walking to the bathroom.  He notes that his girlfriend told him that he had seemed like he was stumbling prior to passing out, and there is a dent in the wall where he fell against it.  He is not having any preceding headache vision change paresthesias weakness chest pain shortness of breath or abdominal pain.  No chest pain or shortness of breath afterward, but he does have some headache from where he believes he hit the wall. ? ?No history of syncope.  He does report intermittent episodes of lightheadedness.  He also gets dyspnea on exertion which he attributes to vape use. ?  ? ? ?Physical Exam  ? ?Triage Vital Signs: ?ED Triage Vitals [12/25/21 1458]  ?Enc Vitals Group  ?   BP 120/84  ?   Pulse Rate 90  ?   Resp 16  ?   Temp 98.6 ?F (37 ?C)  ?   Temp Source Oral  ?   SpO2 99 %  ?   Weight 164 lb (74.4 kg)  ?   Height 6\' 1"  (1.854 m)  ?   Head Circumference   ?   Peak Flow   ?   Pain Score 6  ?   Pain Loc   ?   Pain Edu?   ?   Excl. in GC?   ? ? ?Most recent vital signs: ?Vitals:  ? 12/25/21 1829 12/25/21 1957  ?BP: 122/79 116/70  ?Pulse: 79 76  ?Resp: 16 18  ?Temp:    ?SpO2: 98% 100%  ? ? ? ?General: Awake, no distress.  ?CV:  Good peripheral perfusion.  No murmurs.  Regular rate and rhythm.  Normal peripheral pulses. ?Resp:  Normal effort.  Clear to auscultation bilaterally ?Abd:  No distention.  ?Other:  No lower extremity edema.  Head is atraumatic, no swelling or point tenderness, no wounds.  No midline spinal tenderness.  Full range of motion of the neck.  He is ambulatory. ? ? ?ED  Results / Procedures / Treatments  ? ?Labs ?(all labs ordered are listed, but only abnormal results are displayed) ?Labs Reviewed  ?BASIC METABOLIC PANEL - Abnormal; Notable for the following components:  ?    Result Value  ? Glucose, Bld 101 (*)   ? All other components within normal limits  ?CBC - Abnormal; Notable for the following components:  ? WBC 3.8 (*)   ? MCV 77.4 (*)   ? MCH 25.3 (*)   ? All other components within normal limits  ?URINALYSIS, ROUTINE W REFLEX MICROSCOPIC - Abnormal; Notable for the following components:  ? Color, Urine YELLOW (*)   ? APPearance CLEAR (*)   ? Specific Gravity, Urine >1.046 (*)   ? All other components within normal limits  ?CBG MONITORING, ED  ? ? ? ?EKG ? ?Interpreted by me ?Normal sinus rhythm rate of 85.  Normal axis, normal intervals.  Normal ST segments and T waves.  There are Q waves in inferior lateral leads that are relatively small compared to  R wave amplitudes.  Isolated T wave inversion in lead III which is nonspecific. ? ? ?RADIOLOGY ?CT head viewed and interpreted by me, unremarkable.  Radiology report reviewed. ? ?CT angiogram of the head and neck reviewed, unremarkable. ?CT cervical spine negative for acute injury ? ? ?PROCEDURES: ? ?Critical Care performed: No ? ?Procedures ? ? ?MEDICATIONS ORDERED IN ED: ?Medications  ?ketorolac (TORADOL) 30 MG/ML injection 15 mg (has no administration in time range)  ?iohexol (OMNIPAQUE) 350 MG/ML injection 75 mL (75 mLs Intravenous Contrast Given 12/25/21 1832)  ? ? ? ?IMPRESSION / MDM / ASSESSMENT AND PLAN / ED COURSE  ?I reviewed the triage vital signs and the nursing notes. ?             ?               ? ?Differential diagnosis includes, but is not limited to, intracranial hemorrhage, cerebral aneurysm, vertebral dissection, electrolyte abnormality, anemia, arrhythmia ? ?**The patient is on the cardiac monitor to evaluate for evidence of arrhythmia and/or significant heart rate changes.**} ? ?Patient presents with  syncope without worrisome prodromal symptoms.  Trauma work-up with CT imaging of the head and neck is unremarkable.  No evidence of neurovascular event. ? ?Vitals and exam are normal.  Serum labs are unremarkable.  Urinalysis shows signs of dehydration which may be the cause of his syncope today shortly after waking up.. ? ?Case discussed with cardiology Dr. Ladona Ridgel who advises that EKG is not particularly alarming for HOCM or other potential dysrhythmia.  He does recommend follow-up in cardiology clinic to be evaluated considered for echocardiogram.  No medication needed in the meantime.  Return precautions discussed with patient, advised to avoid strenuous activity in the meantime. ? ?  ? ? ?FINAL CLINICAL IMPRESSION(S) / ED DIAGNOSES  ? ?Final diagnoses:  ?Syncope, unspecified syncope type  ? ? ? ?Rx / DC Orders  ? ?ED Discharge Orders   ? ? None  ? ?  ? ? ? ?Note:  This document was prepared using Dragon voice recognition software and may include unintentional dictation errors. ?  ?Sharman Cheek, MD ?12/25/21 2142 ? ?

## 2021-12-25 NOTE — ED Provider Triage Note (Signed)
Emergency Medicine Provider Triage Evaluation Note ? ?Stephen Ford , a 26 y.o. male  was evaluated in triage.  Pt complains of severe headache.  Patient reports he got up this morning and was completely asymptomatic.  He states that several hours later, he awoke on the ground with a severe headache.  Patient states that he might have hit a door but he is uncertain and does not recall a specific injury.  He has never had a similar headache in the past.  He does have some posterior neck pain.  No numbness or tingling in the upper and lower extremities.  No weakness in the upper and lower extremities.  No chest pain, chest tightness or abdominal pain. ? ?Review of Systems  ?Positive: Patient has atypical headache.  ?Negative: No chest pain or abdominal pain.  ? ?Physical Exam  ?BP 120/84   Pulse 90   Temp 98.6 ?F (37 ?C) (Oral)   Resp 16   Ht 6\' 1"  (1.854 m)   Wt 74.4 kg   SpO2 99%   BMI 21.64 kg/m?  ?Gen:   Awake, no distress   ?Resp:  Normal effort  ?MSK:   Moves extremities without difficulty  ?Other:   ? ?Medical Decision Making  ?Medically screening exam initiated at 6:21 PM.  Appropriate orders placed.  was informed that the remainder of the evaluation will be completed by another provider, this initial triage assessment does not replace that evaluation, and the importance of remaining in the ED until their evaluation is complete. ? ? ?  ?Stephen Ford Jasper, PA-C ?12/25/21 1823 ? ?

## 2021-12-25 NOTE — ED Notes (Signed)
Pt able to tolerate water and saltine crackers with no ill effects. ?

## 2021-12-29 ENCOUNTER — Ambulatory Visit: Payer: Self-pay | Admitting: Cardiology

## 2021-12-30 ENCOUNTER — Encounter: Payer: Self-pay | Admitting: Cardiology

## 2022-02-02 ENCOUNTER — Ambulatory Visit: Payer: Self-pay | Admitting: Cardiology

## 2022-02-03 ENCOUNTER — Encounter: Payer: Self-pay | Admitting: Cardiology

## 2023-03-04 IMAGING — CT CT CERVICAL SPINE W/O CM
3 series · 14 of 33 positions shown, 17 images · non-contrast
Comparison: CT brain, 08/02/2012

CLINICAL DATA: Syncope, fall



[Series 3: c spine soft · axial · 0.28mm/px · z∈[-256,-112]mm · 6 of 95 slices shown, 8 images]
[im 15/95  soft-tissue]
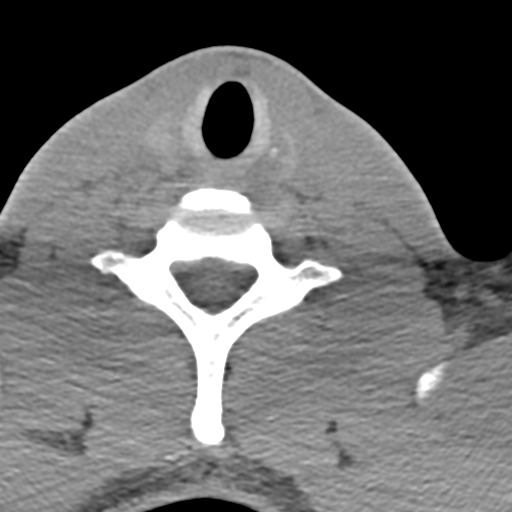
[im 15/95  bone]
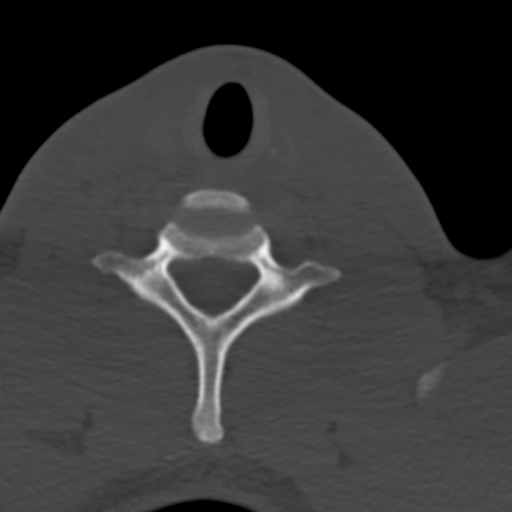
[im 29/95  bone]
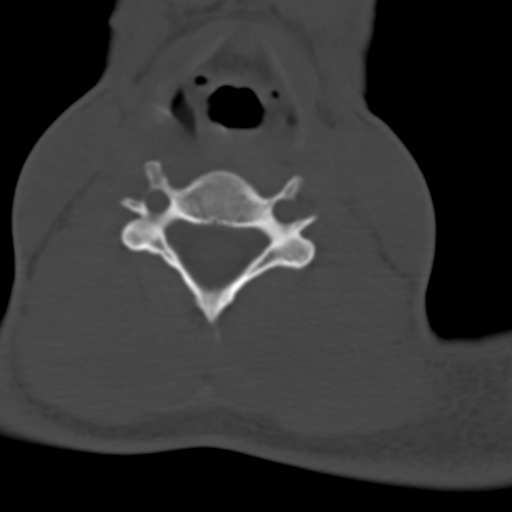
[im 44/95  bone]
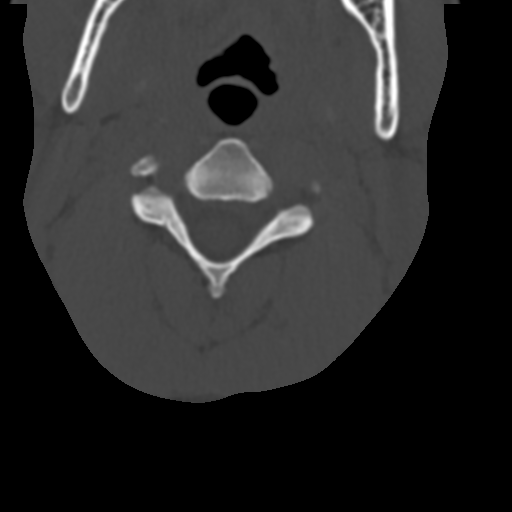
[im 58/95  bone]
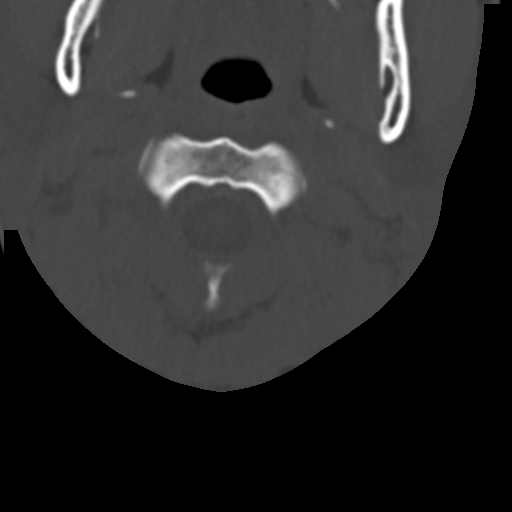
[im 73/95  soft-tissue]
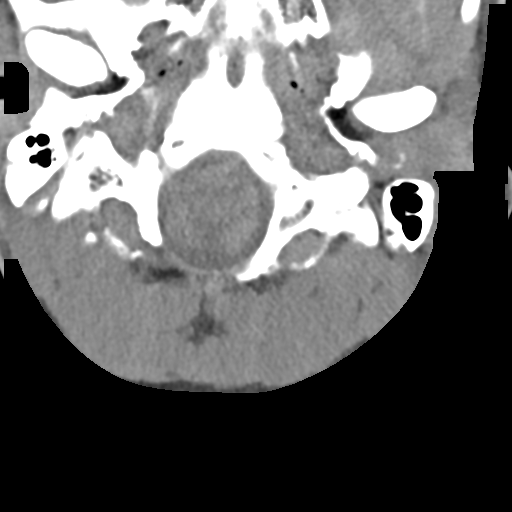
[im 73/95  bone]
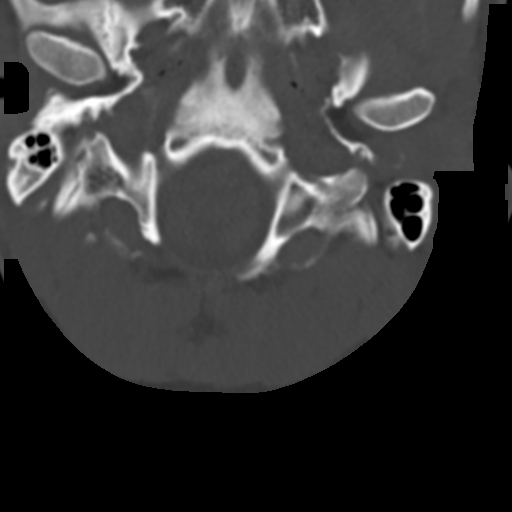
[im 87/95  bone]
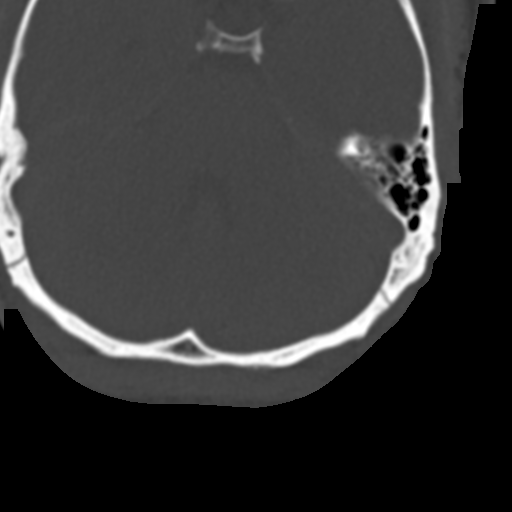

[Series 4: sagittal bone · sagittal · 0.28mm/px · 5 of 219 slices shown, 6 images]
[im 73/219  bone]
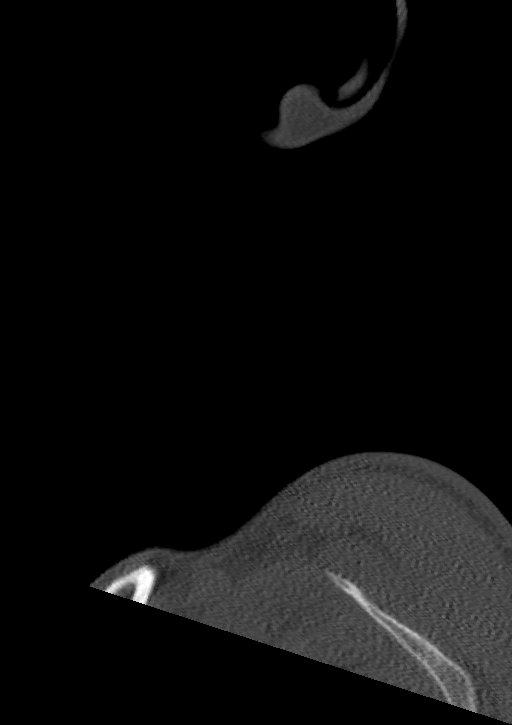
[im 91/219  bone]
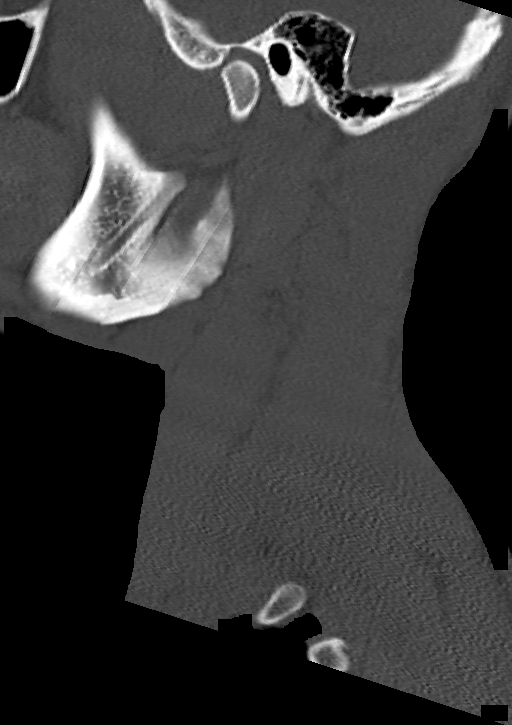
[im 110/219  soft-tissue]
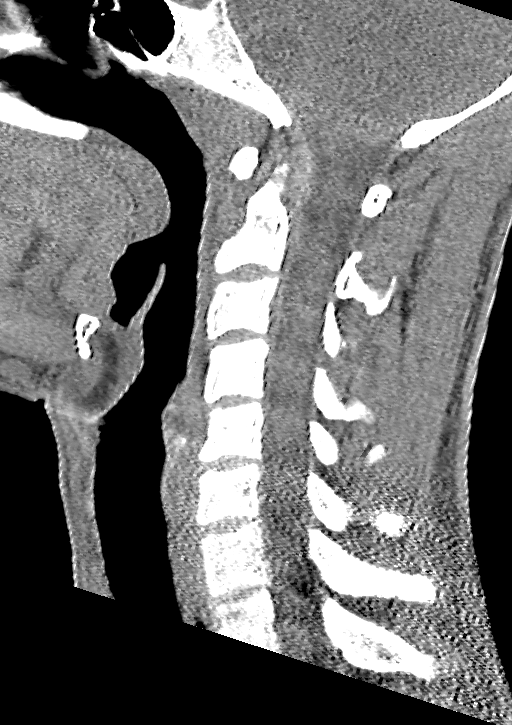
[im 110/219  bone]
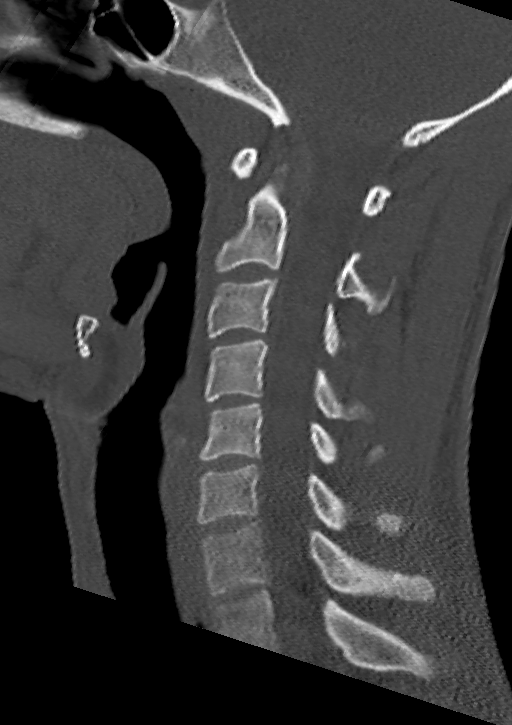
[im 128/219  bone]
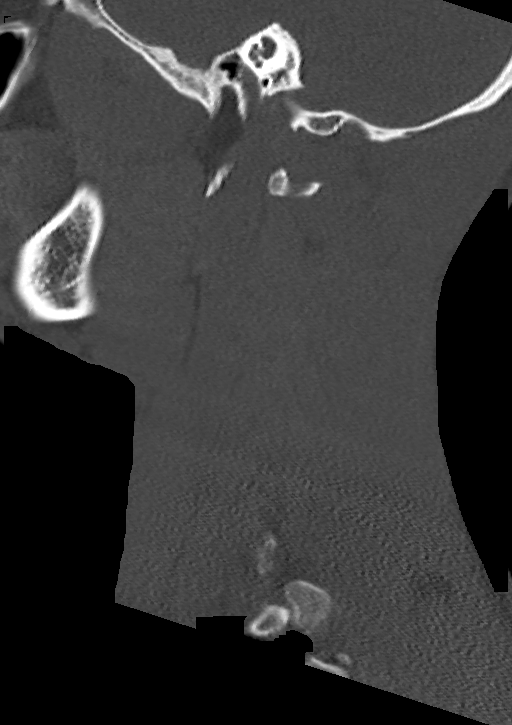
[im 146/219  bone]
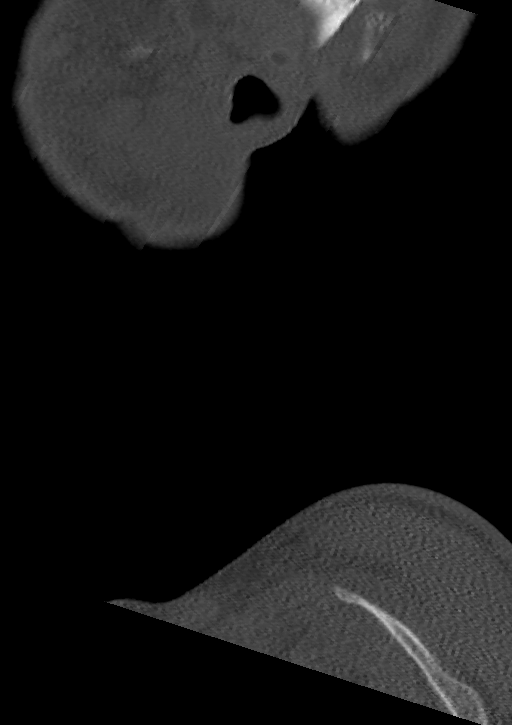

[Series 5: coronal bone · coronal · 0.52mm/px · 3 of 114 slices shown]
[im 23/114  bone]
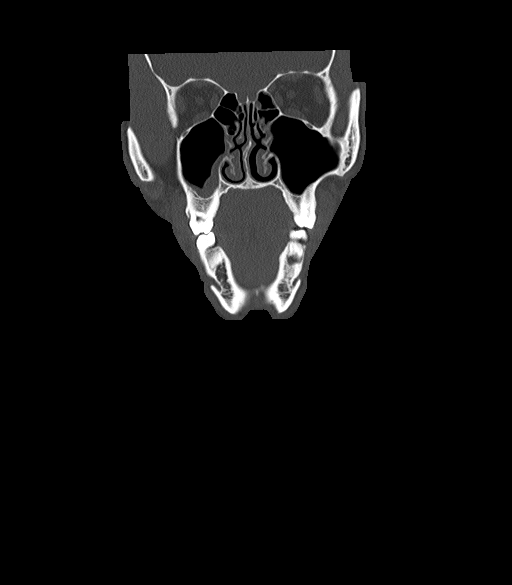
[im 46/114  bone]
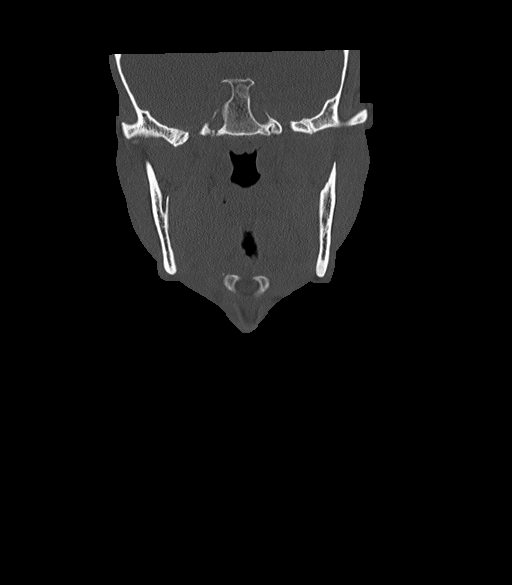
[im 68/114  bone]
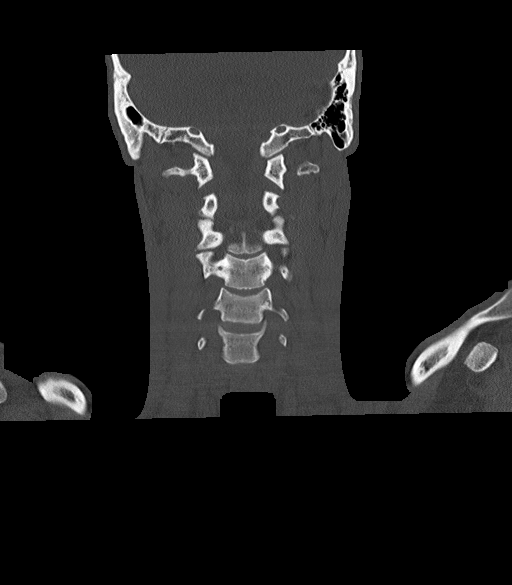

[14 of 33 positions shown; findings below may reference images not displayed]

FINDINGS: CT HEAD FINDINGS

Brain: No evidence of acute infarction, hemorrhage, hydrocephalus,
extra-axial collection or mass lesion/mass effect.

Vascular: No hyperdense vessel or unexpected calcification.

Skull: Normal. Negative for fracture or focal lesion.

Sinuses/Orbits: No acute finding.

Other: None.

CT CERVICAL SPINE FINDINGS

Alignment: Normal.

Skull base and vertebrae: No acute fracture. No primary bone lesion
or focal pathologic process.

Soft tissues and spinal canal: No prevertebral fluid or swelling. No
visible canal hematoma.

Disc levels:  Intact.

Upper chest: Negative.

Other: None.
IMPRESSION: 1. No acute intracranial pathology.
2. No fracture or static subluxation of the cervical spine.

## 2024-01-05 ENCOUNTER — Inpatient Hospital Stay
Admission: AD | Admit: 2024-01-05 | Discharge: 2024-01-09 | DRG: 881 | Disposition: A | Discharge status: Home / Self Care | Source: Other Acute Inpatient Hospital | Attending: Psychiatry | Admitting: Psychiatry

## 2024-01-05 ENCOUNTER — Other Ambulatory Visit: Payer: Self-pay

## 2024-01-05 ENCOUNTER — Encounter: Payer: Self-pay | Admitting: Psychiatry

## 2024-01-05 ENCOUNTER — Emergency Department
Admission: EM | Admit: 2024-01-05 | Discharge: 2024-01-05 | Disposition: A | Discharge status: Other Acute Inpatient Hospital | Attending: Emergency Medicine | Admitting: Emergency Medicine

## 2024-01-05 DIAGNOSIS — Z8711 Personal history of peptic ulcer disease: Secondary | ICD-10-CM

## 2024-01-05 DIAGNOSIS — Z87828 Personal history of other (healed) physical injury and trauma: Secondary | ICD-10-CM | POA: Diagnosis not present

## 2024-01-05 DIAGNOSIS — F1210 Cannabis abuse, uncomplicated: Secondary | ICD-10-CM | POA: Diagnosis present

## 2024-01-05 DIAGNOSIS — R569 Unspecified convulsions: Secondary | ICD-10-CM | POA: Diagnosis present

## 2024-01-05 DIAGNOSIS — Z639 Problem related to primary support group, unspecified: Secondary | ICD-10-CM | POA: Diagnosis not present

## 2024-01-05 DIAGNOSIS — R45851 Suicidal ideations: Secondary | ICD-10-CM | POA: Insufficient documentation

## 2024-01-05 DIAGNOSIS — F4321 Adjustment disorder with depressed mood: Secondary | ICD-10-CM | POA: Diagnosis not present

## 2024-01-05 DIAGNOSIS — F17290 Nicotine dependence, other tobacco product, uncomplicated: Secondary | ICD-10-CM | POA: Diagnosis present

## 2024-01-05 DIAGNOSIS — Z8279 Family history of other congenital malformations, deformations and chromosomal abnormalities: Secondary | ICD-10-CM | POA: Diagnosis not present

## 2024-01-05 DIAGNOSIS — R001 Bradycardia, unspecified: Secondary | ICD-10-CM | POA: Diagnosis not present

## 2024-01-05 DIAGNOSIS — F419 Anxiety disorder, unspecified: Secondary | ICD-10-CM | POA: Diagnosis present

## 2024-01-05 DIAGNOSIS — Z733 Stress, not elsewhere classified: Secondary | ICD-10-CM | POA: Diagnosis not present

## 2024-01-05 DIAGNOSIS — F329 Major depressive disorder, single episode, unspecified: Secondary | ICD-10-CM | POA: Insufficient documentation

## 2024-01-05 HISTORY — DX: Other psychoactive substance abuse, uncomplicated: F19.10

## 2024-01-05 LAB — COMPREHENSIVE METABOLIC PANEL
ALT: 22 U/L (ref 0–44)
AST: 22 U/L (ref 15–41)
Albumin: 5 g/dL (ref 3.5–5.0)
Alkaline Phosphatase: 57 U/L (ref 38–126)
Anion gap: 7 (ref 5–15)
BUN: 15 mg/dL (ref 6–20)
CO2: 29 mmol/L (ref 22–32)
Calcium: 9.4 mg/dL (ref 8.9–10.3)
Chloride: 103 mmol/L (ref 98–111)
Creatinine, Ser: 0.93 mg/dL (ref 0.61–1.24)
GFR, Estimated: >60 mL/min (ref >60)
Glucose, Bld: 54 mg/dL — ABNORMAL LOW (ref 70–99)
Potassium: 4.1 mmol/L (ref 3.5–5.1)
Sodium: 139 mmol/L (ref 135–145)
Total Bilirubin: 1 mg/dL (ref 0.0–1.2)
Total Protein: 8 g/dL (ref 6.5–8.1)

## 2024-01-05 LAB — URINE DRUG SCREEN, QUALITATIVE (ARMC ONLY)
Amphetamines, Ur Screen: NOT DETECTED
Barbiturates, Ur Screen: NOT DETECTED
Benzodiazepine, Ur Scrn: NOT DETECTED
Cannabinoid 50 Ng, Ur ~~LOC~~: POSITIVE — AB
Cocaine Metabolite,Ur ~~LOC~~: NOT DETECTED
MDMA (Ecstasy)Ur Screen: NOT DETECTED
Methadone Scn, Ur: NOT DETECTED
Opiate, Ur Screen: NOT DETECTED
Phencyclidine (PCP) Ur S: NOT DETECTED
Tricyclic, Ur Screen: NOT DETECTED

## 2024-01-05 LAB — ACETAMINOPHEN LEVEL: Acetaminophen (Tylenol), Serum: <10 ug/mL — ABNORMAL LOW (ref 10–30)

## 2024-01-05 LAB — CBC
HCT: 44.3 % (ref 39.0–52.0)
Hemoglobin: 14.3 g/dL (ref 13.0–17.0)
MCH: 26.3 pg (ref 26.0–34.0)
MCHC: 32.3 g/dL (ref 30.0–36.0)
MCV: 81.4 fL (ref 80.0–100.0)
Platelets: 255 10*3/uL (ref 150–400)
RBC: 5.44 MIL/uL (ref 4.22–5.81)
RDW: 14.9 % (ref 11.5–15.5)
WBC: 4.2 10*3/uL (ref 4.0–10.5)
nRBC: 0 % (ref 0.0–0.2)

## 2024-01-05 LAB — SALICYLATE LEVEL: Salicylate Lvl: <7 mg/dL — ABNORMAL LOW (ref 7.0–30.0)

## 2024-01-05 LAB — ETHANOL: Alcohol, Ethyl (B): <10 mg/dL (ref <10)

## 2024-01-05 MED ORDER — DIPHENHYDRAMINE HCL 25 MG PO CAPS: 50.0000 mg | ORAL_CAPSULE | Freq: Three times a day (TID) | ORAL | Status: DC | PRN

## 2024-01-05 MED ORDER — TRAZODONE HCL 50 MG PO TABS: 50.0000 mg | ORAL_TABLET | Freq: Every evening | ORAL | Status: DC | PRN

## 2024-01-05 MED ORDER — HALOPERIDOL 5 MG PO TABS
5.0000 mg | ORAL_TABLET | Freq: Three times a day (TID) | ORAL | Status: DC | PRN
Start: 2024-01-05 — End: 2024-01-09

## 2024-01-05 MED ORDER — ALUM & MAG HYDROXIDE-SIMETH 200-200-20 MG/5ML PO SUSP: 30.0000 mL | ORAL | Status: DC | PRN

## 2024-01-05 MED ORDER — HALOPERIDOL LACTATE 5 MG/ML IJ SOLN: 5.0000 mg | Freq: Three times a day (TID) | INTRAMUSCULAR | Status: DC | PRN

## 2024-01-05 MED ORDER — HALOPERIDOL LACTATE 5 MG/ML IJ SOLN: 10.0000 mg | Freq: Three times a day (TID) | INTRAMUSCULAR | Status: DC | PRN

## 2024-01-05 MED ORDER — LORAZEPAM 2 MG/ML IJ SOLN: 2.0000 mg | Freq: Three times a day (TID) | INTRAMUSCULAR | Status: DC | PRN

## 2024-01-05 MED ORDER — ACETAMINOPHEN 325 MG PO TABS: 650.0000 mg | ORAL_TABLET | Freq: Four times a day (QID) | ORAL | Status: DC | PRN

## 2024-01-05 MED ORDER — DIPHENHYDRAMINE HCL 50 MG/ML IJ SOLN: 50.0000 mg | Freq: Three times a day (TID) | INTRAMUSCULAR | Status: DC | PRN

## 2024-01-05 MED ORDER — HYDROXYZINE HCL 25 MG PO TABS: 25.0000 mg | ORAL_TABLET | Freq: Three times a day (TID) | ORAL | Status: DC | PRN

## 2024-01-05 MED ORDER — MAGNESIUM HYDROXIDE 400 MG/5ML PO SUSP: 30.0000 mL | Freq: Every day | ORAL | Status: DC | PRN
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
## 2024-01-05 NOTE — Plan of Care (Signed)
   Problem: Education: Goal: Knowledge of Agenda General Education information/materials will improve Outcome: Not Met (add Reason) Goal: Emotional status will improve Outcome: Not Met (add Reason) Goal: Mental status will improve Outcome: Not Met (add Reason) Goal: Verbalization of understanding the information provided will improve Outcome: Not Met (add Reason)   Problem: Activity: Goal: Interest or engagement in activities will improve Outcome: Not Met (add Reason) Goal: Sleeping patterns will improve Outcome: Not Met (add Reason)   Problem: Coping: Goal: Ability to verbalize frustrations and anger appropriately will improve Outcome: Not Met (add Reason) Goal: Ability to demonstrate self-control will improve Outcome: Not Met (add Reason)

## 2024-01-05 NOTE — ED Notes (Signed)
 Transferred to [facility] via wheelchair and ED staff. 1 bag of belongings and consent for admission sent with staff member.
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
## 2024-01-05 NOTE — ED Provider Notes (Signed)
 [facility] Provider Note   Event Date/Time   First MD Initiated Contact with Patient 01/05/24 1047     (approximate) History  Suicidal  HPI Stephen Ford is a 28 y.o. male with no stated past medical history presents for suicidal ideation with a plan to run his car into a tree.  Patient states that he has been speaking to his family and friends, he decided to present to the emergency department for further evaluation.  Patient denies any homicidal ideation or auditory/visual hallucinations ROS: Patient currently denies any vision changes, tinnitus, difficulty speaking, facial droop, sore throat, chest pain, shortness of breath, abdominal pain, nausea/vomiting/diarrhea, dysuria, or weakness/numbness/paresthesias in any extremity   Physical Exam  Triage Vital Signs: ED Triage Vitals  Encounter Vitals Group     BP 01/05/24 1023 137/88     Systolic BP Percentile --      Diastolic BP Percentile --      Pulse Rate 01/05/24 1023 91     Resp 01/05/24 1023 20     Temp 01/05/24 1023 97.8 F (36.6 C)     Temp Source 01/05/24 1023 Oral     SpO2 01/05/24 1023 96 %     Weight --      Height --      Head Circumference --      Peak Flow --      Pain Score 01/05/24 1024 0     Pain Loc --      Pain Education --      Exclude from Growth Chart --    Most recent vital signs: Vitals:   01/05/24 1023  BP: 137/88  Pulse: 91  Resp: 20  Temp: 97.8 F (36.6 C)  SpO2: 96%   General: Awake, oriented x4. CV:  Good peripheral perfusion.  Resp:  Normal effort.  Abd:  No distention.  Other:  Young adult well-developed, well-nourished African-American male resting comfortably in no acute distress ED Results / Procedures / Treatments  Labs (all labs ordered are listed, but only abnormal results are displayed) Labs Reviewed  CBC  COMPREHENSIVE METABOLIC PANEL  ETHANOL  SALICYLATE LEVEL  ACETAMINOPHEN LEVEL  URINE DRUG SCREEN, QUALITATIVE ([facility] ONLY)    PROCEDURES: Critical Care performed: No Procedures MEDICATIONS ORDERED IN ED: Medications - No data to display IMPRESSION / MDM / ASSESSMENT AND PLAN / ED COURSE  I reviewed the triage vital signs and the nursing notes.                             The patient is on the cardiac monitor to evaluate for evidence of arrhythmia and/or significant heart rate changes. Patient's presentation is most consistent with acute presentation with potential threat to life or bodily function. Thoughts are linear and organized, and patient has no AH, VH, or HI. Prior suicide attempt: Denies Prior Psychiatric Hospitalizations: Denies  Clinically patient displays no overt toxidrome; they are well appearing, with low suspicion for toxic ingestion given history and exam. Thoughts unlikely 2/2 anemia, hypothyroidism, infection, or ICH.  Consult: Psychiatry to evaluate patient for potential hold for danger to self. Disposition: Admit to psych    FINAL CLINICAL IMPRESSION(S) / ED DIAGNOSES   Final diagnoses:  Suicidal ideation   Rx / DC Orders   ED Discharge Orders     None      Note:  This document was prepared using Dragon voice recognition software and may include unintentional  dictation errors.   Merwyn Katos, MD 01/05/24 [phone]

## 2024-01-05 NOTE — Group Note (Signed)
 LCSW Group Therapy Note  Group Date: 01/05/2024 Start Time: 1400 End Time: 1500   Type of Therapy and Topic:  Group Therapy: Anger Cues and Responses  Participation Level:  Did Not Attend   Description of Group:   In this group, patients learned how to recognize the physical, cognitive, emotional, and behavioral responses they have to anger-provoking situations.  They identified a recent time they became angry and how they reacted.  They analyzed how their reaction was possibly beneficial and how it was possibly unhelpful.  The group discussed a variety of healthier coping skills that could help with such a situation in the future.  Focus was placed on how helpful it is to recognize the underlying emotions to our anger, because working on those can lead to a more permanent solution as well as our ability to focus on the important rather than the urgent.  Therapeutic Goals: Patients will remember their last incident of anger and how they felt emotionally and physically, what their thoughts were at the time, and how they behaved. Patients will identify how their behavior at that time worked for them, as well as how it worked against them. Patients will explore possible new behaviors to use in future anger situations. Patients will learn that anger itself is normal and cannot be eliminated, and that healthier reactions can assist with resolving conflict rather than worsening situations.  Summary of Patient Progress:  X  Therapeutic Modalities:   Cognitive Behavioral Therapy    Harden Mo, LCSW 01/05/2024  3:32 PM

## 2024-01-05 NOTE — ED Triage Notes (Signed)
 Patient states he has had thoughts of wanting to hurt himself by driving himself into a tree; after talking to family and friends, decided to come to the ED to get help.
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
## 2024-01-06 ENCOUNTER — Encounter: Payer: Self-pay | Admitting: Psychiatry

## 2024-01-06 DIAGNOSIS — F4321 Adjustment disorder with depressed mood: Secondary | ICD-10-CM | POA: Diagnosis not present

## 2024-01-06 MED ORDER — NICOTINE POLACRILEX 2 MG MT GUM
2.0000 mg | CHEWING_GUM | OROMUCOSAL | Status: DC | PRN
  Administered 2024-01-06 – 2024-01-08 (×4): 2 mg via ORAL
  Filled 2024-01-06 (×4): qty 1

## 2024-01-06 MED ORDER — SERTRALINE HCL 25 MG PO TABS
50.0000 mg | ORAL_TABLET | Freq: Every day | ORAL | Status: DC
  Administered 2024-01-06 – 2024-01-09 (×4): 50 mg via ORAL
  Filled 2024-01-06 (×4): qty 2
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
## 2024-01-06 NOTE — Group Note (Signed)
 Date:  01/06/2024 Time:  10:26 AM  Group Topic/Focus:  Goals Group:   The focus of this group is to help patients establish daily goals to achieve during treatment and discuss how the patient can incorporate goal setting into their daily lives to aide in recovery.    Participation Level:  Did Not Attend  Participation Quality:    Affect:    Cognitive:    Insight:   Engagement in Group:    Modes of Intervention:    Additional Comments:    Ophelia Shoulder 01/06/2024, 10:26 AM

## 2024-01-06 NOTE — Plan of Care (Signed)
  Problem: Education: Goal: Knowledge of [place] [facility] Education information/materials will improve Outcome: Not Met (add Reason) Goal: Emotional status will improve Outcome: Not Met (add Reason) Goal: Mental status will improve Outcome: Not Met (add Reason) Goal: Verbalization of understanding the information provided will improve Outcome: Not Met (add Reason)   Problem: Activity: Goal: Interest or engagement in activities will improve Outcome: Not Met (add Reason) Goal: Sleeping patterns will improve Outcome: Not Met (add Reason)   Problem: Coping: Goal: Ability to verbalize frustrations and anger appropriately will improve Outcome: Not Met (add Reason) Goal: Ability to demonstrate self-control will improve Outcome: Not Met (add Reason)   Problem: Safety: Goal: Periods of time without injury will increase Outcome: Not Met (add Reason)

## 2024-01-06 NOTE — [facility] Suicide Risk Assessment (Signed)
 [facility] INPATIENT:  Family/Significant Other Suicide Prevention Education  Suicide Prevention Education:  Patient Refusal for Family/Significant Other Suicide Prevention Education: The patient Stephen Ford has refused to provide written consent for family/significant other to be provided Family/Significant Other Suicide Prevention Education during admission and/or prior to discharge.  Physician notified.  SPE completed with pt, as pt refused to consent to family contact. SPI pamphlet provided to pt and pt was encouraged to share information with support network, ask questions, and talk about any concerns relating to SPE. Pt denies access to guns/firearms and verbalized understanding of information provided. Mobile Crisis information also provided to pt.  Glenis Smoker 01/06/2024, 10:07 AM

## 2024-01-06 NOTE — BH IP Treatment Plan (Signed)
 Interdisciplinary Treatment and Diagnostic Plan Update  01/06/2024 Time of Session: 10:37 Stephen ANGERT MRN: [id number]  Principal Diagnosis: Adjustment disorder with depressed mood  Secondary Diagnoses: Principal Problem:   Adjustment disorder with depressed mood   Current Medications:  Current Facility-Administered Medications  Medication Dose Route Frequency Provider Last Rate Last Admin   acetaminophen (TYLENOL) tablet 650 mg  650 mg Oral Q6H PRN Penn, Cicely, NP       alum & mag hydroxide-simeth (MAALOX/MYLANTA) 200-200-20 MG/5ML suspension 30 mL  30 mL Oral Q4H PRN Penn, Cicely, NP       haloperidol (HALDOL) tablet 5 mg  5 mg Oral TID PRN Mcneil Sober, NP       And   diphenhydrAMINE (BENADRYL) capsule 50 mg  50 mg Oral TID PRN Penn, Cranston Neighbor, NP       haloperidol lactate (HALDOL) injection 5 mg  5 mg Intramuscular TID PRN Penn, Cranston Neighbor, NP       And   diphenhydrAMINE (BENADRYL) injection 50 mg  50 mg Intramuscular TID PRN Penn, Cranston Neighbor, NP       And   LORazepam (ATIVAN) injection 2 mg  2 mg Intramuscular TID PRN Mcneil Sober, NP       haloperidol lactate (HALDOL) injection 10 mg  10 mg Intramuscular TID PRN Penn, Cranston Neighbor, NP       And   diphenhydrAMINE (BENADRYL) injection 50 mg  50 mg Intramuscular TID PRN Penn, Cranston Neighbor, NP       And   LORazepam (ATIVAN) injection 2 mg  2 mg Intramuscular TID PRN Mcneil Sober, NP       hydrOXYzine (ATARAX) tablet 25 mg  25 mg Oral TID PRN Penn, Cranston Neighbor, NP       magnesium hydroxide (MILK OF MAGNESIA) suspension 30 mL  30 mL Oral Daily PRN Penn, Cicely, NP       traZODone (DESYREL) tablet 50 mg  50 mg Oral QHS PRN Penn, Cicely, NP       PTA Medications: No medications prior to admission.    Patient Stressors:    Patient Strengths:    Treatment Modalities: Medication Management, Group therapy, Case management,  1 to 1 session with clinician, Psychoeducation, Recreational therapy.   Physician Treatment Plan for Primary Diagnosis: Adjustment  disorder with depressed mood Long Term Goal(s):     Short Term Goals:    Medication Management: Evaluate patient's response, side effects, and tolerance of medication regimen.  Therapeutic Interventions: 1 to 1 sessions, Unit Group sessions and Medication administration.  Evaluation of Outcomes: Not Met  Physician Treatment Plan for Secondary Diagnosis: Principal Problem:   Adjustment disorder with depressed mood  Long Term Goal(s):     Short Term Goals:       Medication Management: Evaluate patient's response, side effects, and tolerance of medication regimen.  Therapeutic Interventions: 1 to 1 sessions, Unit Group sessions and Medication administration.  Evaluation of Outcomes: Not Met   RN Treatment Plan for Primary Diagnosis: Adjustment disorder with depressed mood Long Term Goal(s): Knowledge of disease and therapeutic regimen to maintain health will improve  Short Term Goals: Ability to remain free from injury will improve, Ability to verbalize frustration and anger appropriately will improve, Ability to demonstrate self-control, Ability to participate in decision making will improve, Ability to verbalize feelings will improve, Ability to disclose and discuss suicidal ideas, Ability to identify and develop effective coping behaviors will improve, and Compliance with prescribed medications will improve  Medication Management: RN will administer medications as  ordered by provider, will assess and evaluate patient's response and provide education to patient for prescribed medication. RN will report any adverse and/or side effects to prescribing provider.  Therapeutic Interventions: 1 on 1 counseling sessions, Psychoeducation, Medication administration, Evaluate responses to treatment, Monitor vital signs and CBGs as ordered, Perform/monitor CIWA, COWS, AIMS and Fall Risk screenings as ordered, Perform wound care treatments as ordered.  Evaluation of Outcomes: Not Met   LCSW  Treatment Plan for Primary Diagnosis: Adjustment disorder with depressed mood Long Term Goal(s): Safe transition to appropriate next level of care at discharge, Engage patient in therapeutic group addressing interpersonal concerns.  Short Term Goals: Engage patient in aftercare planning with referrals and resources, Increase social support, Increase ability to appropriately verbalize feelings, Increase emotional regulation, Facilitate acceptance of mental health diagnosis and concerns, Identify triggers associated with mental health/substance abuse issues, and Increase skills for wellness and recovery  Therapeutic Interventions: Assess for all discharge needs, 1 to 1 time with Social worker, Explore available resources and support systems, Assess for adequacy in community support network, Educate family and significant other(s) on suicide prevention, Complete Psychosocial Assessment, Interpersonal group therapy.  Evaluation of Outcomes: Not Met   Progress in Treatment: Attending groups: No. Participating in groups: No. Taking medication as prescribed: No. Toleration medication: No. Family/Significant other contact made: No, will contact:  if given permission. Patient understands diagnosis: Yes. Discussing patient identified problems/goals with staff: Yes. Medical problems stabilized or resolved: Yes. Denies suicidal/homicidal ideation: Yes. Issues/concerns per patient self-inventory: No. Other: none.   New problem(s) identified: No, Describe:  none identified.  New Short Term/Long Term Goal(s): medication management for mood stabilization; elimination of SI thoughts; development of comprehensive mental wellness/sobriety plan.  Patient Goals:  "To sleep better, I guess."   Discharge Plan or Barriers: CSW will assist pt with development of an appropriate aftercare/discharge plan.   Reason for Continuation of Hospitalization: Depression Medication stabilization Suicidal  ideation  Estimated Length of Stay: 1-7 days  Last 3 [place] Suicide Severity Risk Score: Flowsheet Row Admission (Current) from 01/05/2024 in [facility] Most recent reading at 01/05/2024  3:00 PM ED from 01/05/2024 in [facility] Emergency Department at [facility] Most recent reading at 01/05/2024 10:22 AM ED from 12/25/2021 in [facility] Emergency Department at [facility] Most recent reading at 12/25/2021  3:00 PM  C-SSRS RISK CATEGORY Error: Q7 should not be populated when Q6 is No High Risk No Risk       Last PHQ 2/9 Scores:     No data to display          Scribe for Treatment Team: Glenis Smoker, LCSW 01/06/2024 11:10 AM
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
## 2024-01-06 NOTE — Group Note (Signed)
 Date:  01/06/2024 Time:  9:28 PM  Group Topic/Focus:  Making Healthy Choices:   The focus of this group is to help patients identify negative/unhealthy choices they were using prior to admission and identify positive/healthier coping strategies to replace them upon discharge. Managing Feelings:   The focus of this group is to identify what feelings patients have difficulty handling and develop a plan to handle them in a healthier way upon discharge. Wrap-Up Group:   The focus of this group is to help patients review their daily goal of treatment and discuss progress on daily workbooks.    Participation Level:  Minimal  Participation Quality:  Appropriate  Affect:  Depressed  Cognitive:  Appropriate  Insight: Limited  Engagement in Group:  Limited  Modes of Intervention:  Discussion and Support  Additional Comments:  n/a  Lorenda Ishihara 01/06/2024, 9:28 PM

## 2024-01-06 NOTE — Progress Notes (Signed)
   01/06/24 1100  Psych Admission Type (Psych Patients Only)  Admission Status Voluntary  Psychosocial Assessment  Patient Complaints None  Eye Contact Brief  Facial Expression Flat;Sad  Affect Sad  Speech Logical/coherent  Interaction Minimal  Motor Activity Slow  Appearance/Hygiene Unremarkable  Behavior Characteristics Cooperative;Appropriate to situation  Mood Depressed  Thought Process  Coherency WDL  Content WDL  Delusions None reported or observed  Perception WDL  Hallucination None reported or observed  Judgment Impaired  Confusion WDL  Danger to Self  Current suicidal ideation? Denies  Agreement Not to Harm Self Yes  Description of Agreement verbal

## 2024-01-06 NOTE — Plan of Care (Signed)
   Problem: Education: Goal: Knowledge of [facility] Education information/materials will improve Outcome: Progressing Goal: Emotional status will improve Outcome: Progressing Goal: Mental status will improve Outcome: Progressing

## 2024-01-07 DIAGNOSIS — F4321 Adjustment disorder with depressed mood: Secondary | ICD-10-CM | POA: Diagnosis not present

## 2024-01-07 LAB — LIPID PANEL
Cholesterol: 197 mg/dL (ref 0–200)
HDL: 65 mg/dL (ref >40)
LDL Cholesterol: 122 mg/dL — ABNORMAL HIGH (ref 0–99)
Total CHOL/HDL Ratio: 3 ratio
Triglycerides: 49 mg/dL (ref <150)
VLDL: 10 mg/dL (ref 0–40)

## 2024-01-07 LAB — HEMOGLOBIN A1C
Hgb A1c MFr Bld: 5.3 % (ref 4.8–5.6)
Mean Plasma Glucose: 105.41 mg/dL

## 2024-01-07 NOTE — Plan of Care (Signed)

## 2024-01-07 NOTE — Progress Notes (Signed)
 [facility] MD Progress Note  01/07/2024 9:36 AM Stephen Ford  MRN:  [id number]   28 year old male with no previous psychiatric hx, hx of seizures as a child, marijuana abuse,and nicotine dependence presented to [facility] ED yesterday with is friend, with chief complaint of suicidal thoughts with a plan to drive his car into a tree.    Subjective: Patient's case discussed with multidisciplinary team, all vitals and notes were reviewed.  No reported behavioral issues overnight.  Patient is seen for reassessment, reports that he is " pretty good".  Continues to be discharged focused.  Reports good sleep.  Appetite is good, however he did not have breakfast because of stomach issues, states that he thinks the dinner from night before upset his stomach.  Does report some mild anxiety related to wanting to be discharged.  Denies any depressive symptoms.  Denies SI/HI and AVH.  Has been attending groups.  Denies medication side effects.  Per nursing staff, patient did endorse mild depression which he rated as a 2 out of 10, anxiety as a 3 out of 10 on self inventory list.  He is med compliant.  LDL elevated at 122-we will recommend lifestyle changes, diet modifications A1c 5.3  Principal Problem: Adjustment disorder with depressed mood Diagnosis: Principal Problem:   Adjustment disorder with depressed mood  Total Time spent with patient: 30 minutes  Past Psychiatric History:  DX: denies  Outpatient provider: denies  Current caregiver:  Patient is own guardian/ care giver Past hospitalizations:  denies  Medication trials : denies  Suicide attempts: x3; lying on a railroad track 4 yrs ago  Self harm: via cutting, beginning at 28 yrs old, a few times over summer holiday, "I was able to feel something other than worthlessness I suppose"  Patient denies ever having an Act/CST team. Denies ECT, Clozaril treatments.  Past Medical History:  Past Medical History:  Diagnosis Date   Gastric ulcer     Headache(784.0)    Stomach ulcer    Substance abuse (HCC)     Past Surgical History:  Procedure Laterality Date   CRANIOTOMY     Family History:  Family History  Problem Relation Age of Onset   Other Other        Maternal Great Aunt had Down's Syndrome   Blindness Other        Maternal Programme researcher, broadcasting/film/video   Family Psychiatric  History:  mother, sisters, maternal grandmother - bipolar No hx of suicide  Maternal uncle - crack cocaine   Social History:  Social History   Substance and Sexual Activity  Alcohol Use No     Social History   Substance and Sexual Activity  Drug Use Yes   Types: Marijuana   Comment: daily    Social History   Socioeconomic History   Marital status: Single    Spouse name: Not on file   Number of children: 0   Years of education: Not on file   Highest education level: High school graduate  Occupational History   Not on file  Tobacco Use   Smoking status: Never   Smokeless tobacco: Never  Substance and Sexual Activity   Alcohol use: No   Drug use: Yes    Types: Marijuana    Comment: daily   Sexual activity: Not on file  Other Topics Concern   Not on file  Social History Narrative   ** Merged History Encounter **       Social Drivers of Health  Financial Resource Strain: Not on file  Food Insecurity: No Food Insecurity (01/05/2024)   Hunger Vital Sign    Worried About Running Out of Food in the Last Year: Never true    Ran Out of Food in the Last Year: Never true  Transportation Needs: No Transportation Needs (01/05/2024)   PRAPARE - Administrator, Civil Service (Medical): No    Lack of Transportation (Non-Medical): No  Physical Activity: Not on file  Stress: Not on file  Social Connections: Not on file   Additional Social History:                         Sleep: Good  Appetite:  Good  Current Medications: Current Facility-Administered Medications  Medication Dose Route Frequency Provider Last Rate  Last Admin   acetaminophen (TYLENOL) tablet 650 mg  650 mg Oral Q6H PRN Karesa Maultsby, PA-C       alum & mag hydroxide-simeth (MAALOX/MYLANTA) 200-200-20 MG/5ML suspension 30 mL  30 mL Oral Q4H PRN Kensie Susman, PA-C       haloperidol (HALDOL) tablet 5 mg  5 mg Oral TID PRN Carylon Tamburro, PA-C       And   diphenhydrAMINE (BENADRYL) capsule 50 mg  50 mg Oral TID PRN Seriyah Collison, PA-C       haloperidol lactate (HALDOL) injection 5 mg  5 mg Intramuscular TID PRN Jaelen Gellerman, PA-C       And   diphenhydrAMINE (BENADRYL) injection 50 mg  50 mg Intramuscular TID PRN Jianni Batten, PA-C       And   LORazepam (ATIVAN) injection 2 mg  2 mg Intramuscular TID PRN Siani Utke, PA-C       haloperidol lactate (HALDOL) injection 10 mg  10 mg Intramuscular TID PRN Ciella Obi, PA-C       And   diphenhydrAMINE (BENADRYL) injection 50 mg  50 mg Intramuscular TID PRN Lina Hitch, PA-C       And   LORazepam (ATIVAN) injection 2 mg  2 mg Intramuscular TID PRN Joann Jorge, PA-C       hydrOXYzine (ATARAX) tablet 25 mg  25 mg Oral TID PRN Julies Carmickle, PA-C       magnesium hydroxide (MILK OF MAGNESIA) suspension 30 mL  30 mL Oral Daily PRN Marykate Heuberger, PA-C       nicotine polacrilex (NICORETTE) gum 2 mg  2 mg Oral PRN Verner Chol, MD   2 mg at 01/06/24 2140   sertraline (ZOLOFT) tablet 50 mg  50 mg Oral Daily Domingo Fuson, PA-C   50 mg at 01/07/24 7253   traZODone (DESYREL) tablet 50 mg  50 mg Oral QHS PRN Coda Mathey, Judeth Cornfield, PA-C        Lab Results:  Results for orders placed or performed during the hospital encounter of 01/05/24 (from the past 48 hours)  Lipid panel     Status: Abnormal   Collection Time: 01/07/24  6:36 AM  Result Value Ref Range   Cholesterol 197 0 - 200 mg/dL   Triglycerides 49 <664 mg/dL   HDL 65 >40 mg/dL   Total CHOL/HDL Ratio 3.0 RATIO   VLDL 10 0 - 40 mg/dL   LDL Cholesterol 347 (H) 0  - 99 mg/dL    Comment:        Total Cholesterol/HDL:CHD Risk Coronary Heart Disease Risk Table  Men   Women  1/2 Average Risk   3.4   3.3  Average Risk       5.0   4.4  2 X Average Risk   9.6   7.1  3 X Average Risk  23.4   11.0        Use the calculated Patient Ratio above and the CHD Risk Table to determine the patient's CHD Risk.        ATP III CLASSIFICATION (LDL):  <100     mg/dL   Optimal  130-865  mg/dL   Near or Above                    Optimal  130-159  mg/dL   Borderline  784-696  mg/dL   High  >295     mg/dL   Very High Performed at [facility], [address] [place], [place] [address]     Blood Alcohol level:  Lab Results  Component Value Date   [facility] <10 01/05/2024    Metabolic Disorder Labs: No results found for: "HGBA1C", "MPG" No results found for: "PROLACTIN" Lab Results  Component Value Date   CHOL 197 01/07/2024   TRIG 49 01/07/2024   HDL 65 01/07/2024   CHOLHDL 3.0 01/07/2024   VLDL 10 01/07/2024   LDLCALC 122 (H) 01/07/2024    Physical Findings: AIMS:  , ,  ,  ,    CIWA:    COWS:     Musculoskeletal: Strength & Muscle Tone: within normal limits Gait & Station: normal Patient leans: N/A  Psychiatric Specialty Exam:  Presentation  General Appearance:  Appropriate for Environment  Eye Contact: Good  Speech: Clear and Coherent; Normal Rate  Speech Volume: Normal  Handedness:No data recorded  Mood and Affect  Mood: Anxious  Affect: Appropriate   Thought Process  Thought Processes: Coherent; Linear  Descriptions of Associations:Intact  Orientation:Full (Time, Place and Person)  Thought Content:Logical  History of Schizophrenia/Schizoaffective disorder:No  Duration of Psychotic Symptoms:No data recorded Hallucinations:Hallucinations: None  Ideas of Reference:None  Suicidal Thoughts:Suicidal Thoughts: No  Homicidal Thoughts:Homicidal Thoughts: No   Sensorium   Memory: Immediate Good; Recent Good; Remote Good  Judgment: Fair  Insight: Fair   Art therapist  Concentration: Good  Attention Span: Good  Recall: Good  Fund of Knowledge: Good  Language: Good   Psychomotor Activity  Psychomotor Activity: Psychomotor Activity: Normal   Assets  Assets: Communication Skills; Desire for Improvement; Housing; Physical Health; Social Support; Transportation   Sleep  Sleep: Sleep: Fair    Physical Exam: Physical Exam Vitals and nursing note reviewed.  Constitutional:      Appearance: Normal appearance.  HENT:     Head: Normocephalic and atraumatic.  Eyes:     Extraocular Movements: Extraocular movements intact.  Pulmonary:     Effort: Pulmonary effort is normal.  Musculoskeletal:        General: Normal range of motion.  Skin:    General: Skin is dry.  Neurological:     General: No focal deficit present.     Mental Status: He is alert and oriented to person, place, and time. Mental status is at baseline.  Psychiatric:        Attention and Perception: Attention and perception normal.        Mood and Affect: Affect normal. Mood is anxious.        Speech: Speech normal.        Behavior: Behavior normal. Behavior is cooperative.  Thought Content: Thought content normal.        Cognition and Memory: Cognition and memory normal.        Judgment: Judgment is impulsive.    Review of Systems  Psychiatric/Behavioral:  Positive for substance abuse. The patient is nervous/anxious.   All other systems reviewed and are negative.  Blood pressure 112/69, pulse 72, temperature (!) 97.5 F (36.4 C), resp. rate 15, height 5\' 9"  (1.753 m), weight 83 kg, SpO2 99%. Body mass index is 27.02 kg/m.   Treatment Plan Summary: Adjustment disorder with depressed mood   1.    Safety and Monitoring:   --  Voluntary admission to inpatient psychiatric unit for safety, stabilization and treatment -- Daily contact with  patient to assess and evaluate symptoms and progress in treatment -- Patient's case to be discussed in multi-disciplinary team meeting -- Observation Level : q15 minute checks -- Vital signs:  q12 hours -- Precautions: suicide   2. Psychiatric Diagnoses and Treatment:   01/07/2024 -- Continue Zoloft 50 mg every day for depressive sxs/anxiety -- Continue Hydroxyzine 25 mg q6h PRN for anxiety  -- Continue Trazodone 50 mg HS PRN for sleep   01/06/2024 -- Start Zoloft 50 mg every day for depressive sxs -- Continue Hydroxyzine 25 mg q6h PRN for anxiety  -- Continue Trazodone 50 mg HS PRN for sleep    --  The risks/benefits/side-effects/alternatives to this medication were discussed in detail with the patient and time was given for questions. The patient consents to medication trial. -- Encouraged patient to participate in unit milieu and in scheduled group therapies -- Short Term Goals: Ability to identify changes in lifestyle to reduce recurrence of condition will improve, Ability to verbalize feelings will improve, Ability to disclose and discuss suicidal ideas, Ability to demonstrate self-control will improve, Ability to identify and develop effective coping behaviors will improve, Ability to maintain clinical measurements within normal limits will improve, Compliance with prescribed medications will improve, and Ability to identify triggers associated with substance abuse/mental health issues will improve -- Long Term Goals: Improvement in symptoms so as ready for discharge        3. Medical Issues Being Addressed:               Marijuana abuse              -- counseling on risks of continued use               Tobacco Use Disorder             -- Nicotine gum 2mg  PRN             -- Smoking cessation encouraged   4. Discharge Planning:   -- Social work and case management to assist with discharge planning and identification of hospital follow-up needs prior to discharge -- Estimated  LOS: 5-7 days -- Discharge Concerns: Need to establish a safety plan; Medication compliance and effectiveness -- Discharge Goals: Return home with outpatient referrals for mental health follow-up including medication management/psychotherapy  Paulene Floor, PA-C 01/07/2024, 9:36 AM

## 2024-01-07 NOTE — Group Note (Signed)
 Date:  01/07/2024 Time:  9:08 PM  Group Topic/Focus:  Developing a Wellness Toolbox:   The focus of this group is to help patients develop a "wellness toolbox" with skills and strategies to promote recovery upon discharge. Identifying Needs:   The focus of this group is to help patients identify their personal needs that have been historically problematic and identify healthy behaviors to address their needs. Overcoming Stress:   The focus of this group is to define stress and help patients assess their triggers.    Participation Level:  Active  Participation Quality:  Appropriate  Affect:  Appropriate  Cognitive:  Appropriate  Insight: Appropriate  Engagement in Group:  Limited  Modes of Intervention:  Discussion and Support  Additional Comments:  n/a  Lorenda Ishihara 01/07/2024, 9:08 PM

## 2024-01-07 NOTE — Progress Notes (Signed)
   01/07/24 1800  Psych Admission Type (Psych Patients Only)  Admission Status Voluntary  Psychosocial Assessment  Patient Complaints None (patient states that overall "I feel great".)  Eye Contact Fair  Facial Expression Flat  Affect Appropriate to circumstance  Speech Logical/coherent  Interaction Assertive  Motor Activity Slow  Appearance/Hygiene Unremarkable;In scrubs  Behavior Characteristics Cooperative;Appropriate to situation  Mood Pleasant (patient's goal for today is to "maintain good feelings".)  Aggressive Behavior  Effect No apparent injury  Thought Process  Coherency WDL  Content WDL  Delusions None reported or observed  Perception WDL  Hallucination None reported or observed  Judgment WDL  Confusion None  Danger to Self  Current suicidal ideation? Denies  Agreement Not to Harm Self Yes  Description of Agreement Verbal  Danger to Others  Danger to Others None reported or observed

## 2024-01-07 NOTE — Group Note (Signed)
 Date:  01/07/2024 Time:  6:17 PM  Group Topic/Focus:  Wellness Toolbox:   The focus of this group is to discuss various aspects of wellness, balancing those aspects and exploring ways to increase the ability to experience wellness.  Patients will create a wellness toolbox for use upon discharge.    Participation Level:  Active  Participation Quality:  Appropriate  Affect:  Appropriate  Cognitive:  Appropriate  Insight: Appropriate  Engagement in Group:  Engaged  Modes of Intervention:  Activity  Additional Comments:    Wilford Corner 01/07/2024, 6:17 PM

## 2024-01-07 NOTE — Group Note (Signed)
 Date:  01/07/2024 Time:  10:24 AM  Group Topic/Focus:  Making Healthy Choices:   The focus of this group is to help patients identify negative/unhealthy choices they were using prior to admission and identify positive/healthier coping strategies to replace them upon discharge.    Participation Level:  Active  Participation Quality:  Appropriate  Affect:  Appropriate  Cognitive:  Appropriate  Insight: Appropriate  Engagement in Group:  Engaged  Modes of Intervention:  Discussion, Education, and Support  Additional Comments:    Wilford Corner 01/07/2024, 10:24 AM

## 2024-01-07 NOTE — Progress Notes (Signed)
   01/06/24 2011  Psych Admission Type (Psych Patients Only)  Admission Status Voluntary  Psychosocial Assessment  Patient Complaints Anxiety  Eye Contact Brief;Fair  Facial Expression Flat  Affect Sad  Speech Logical/coherent  Interaction Assertive  Motor Activity Slow  Appearance/Hygiene Improved  Behavior Characteristics Cooperative;Unable to participate  Mood Pleasant  Thought Process  Coherency WDL  Content WDL  Delusions None reported or observed  Perception WDL  Hallucination None reported or observed  Judgment Impaired  Confusion WDL  Danger to Self  Current suicidal ideation? Denies

## 2024-01-07 NOTE — Plan of Care (Signed)
  Problem: Education: Goal: Mental status will improve Outcome: Progressing   Problem: Education: Goal: Emotional status will improve Outcome: Progressing   Problem: Activity: Goal: Interest or engagement in activities will improve Outcome: Progressing

## 2024-01-08 ENCOUNTER — Other Ambulatory Visit: Payer: Self-pay

## 2024-01-08 DIAGNOSIS — F4321 Adjustment disorder with depressed mood: Secondary | ICD-10-CM | POA: Diagnosis not present

## 2024-01-08 MED ORDER — NICOTINE POLACRILEX 2 MG MT GUM
2.0000 mg | CHEWING_GUM | OROMUCOSAL | 0 refills | Status: AC | PRN
  Filled 2024-01-08: qty 100, 10d supply, fill #0

## 2024-01-08 MED ORDER — TRAZODONE HCL 50 MG PO TABS
50.0000 mg | ORAL_TABLET | Freq: Every evening | ORAL | 0 refills | Status: AC | PRN
  Filled 2024-01-08: qty 30, 30d supply, fill #0

## 2024-01-08 MED ORDER — HYDROXYZINE HCL 25 MG PO TABS
25.0000 mg | ORAL_TABLET | Freq: Three times a day (TID) | ORAL | 0 refills | Status: AC | PRN
  Filled 2024-01-08: qty 30, 10d supply, fill #0

## 2024-01-08 MED ORDER — SERTRALINE HCL 50 MG PO TABS
50.0000 mg | ORAL_TABLET | Freq: Every day | ORAL | 0 refills | Status: AC
  Filled 2024-01-08: qty 30, 30d supply, fill #0
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
## 2024-01-08 NOTE — Plan of Care (Signed)

## 2024-01-08 NOTE — Progress Notes (Signed)
   01/08/24 0200  Psych Admission Type (Psych Patients Only)  Admission Status Voluntary  Psychosocial Assessment  Patient Complaints None  Eye Contact Brief  Facial Expression Flat  Affect Appropriate to circumstance  Speech Logical/coherent  Interaction Assertive  Motor Activity  (WDL)  Appearance/Hygiene Unremarkable  Behavior Characteristics Cooperative;Appropriate to situation  Thought Process  Coherency WDL  Content WDL  Delusions None reported or observed  Perception WDL  Hallucination None reported or observed  Judgment WDL  Confusion None  Danger to Self  Current suicidal ideation? Denies  Agreement Not to Harm Self Yes  Description of Agreement verbal  Danger to Others  Danger to Others None reported or observed

## 2024-01-08 NOTE — Group Note (Signed)
 Date:  01/08/2024 Time:  8:49 PM  Group Topic/Focus:  Coping With Mental Health Crisis:   The purpose of this group is to help patients identify strategies for coping with mental health crisis.  Group discusses possible causes of crisis and ways to manage them effectively.    Participation Level:  Active  Participation Quality:  Appropriate  Affect:  Appropriate  Cognitive:  Appropriate  Insight: Appropriate  Engagement in Group:  Engaged  Modes of Intervention:  Activity  Additional Comments:    Eaven Schwager 01/08/2024, 8:49 PM

## 2024-01-08 NOTE — Progress Notes (Signed)
   01/08/24 1000  Psych Admission Type (Psych Patients Only)  Admission Status Voluntary  Psychosocial Assessment  Patient Complaints None  Eye Contact Fair  Facial Expression Other (Comment) (appropriate)  Affect Appropriate to circumstance  Speech Logical/coherent  Interaction Assertive  Motor Activity Other (Comment) (WNL)  Appearance/Hygiene Unremarkable  Behavior Characteristics Cooperative;Appropriate to situation  Mood Pleasant (pt states is mood is normal)  Thought Process  Coherency WDL  Content WDL  Delusions None reported or observed  Perception WDL  Hallucination None reported or observed  Judgment WDL  Confusion WDL  Danger to Self  Current suicidal ideation? Denies  Agreement Not to Harm Self Yes  Description of Agreement verbal  Danger to Others  Danger to Others None reported or observed

## 2024-01-08 NOTE — Plan of Care (Signed)
  Problem: Education: Goal: Knowledge of [place] [facility] Education information/materials will improve Outcome: Progressing Goal: Emotional status will improve Outcome: Progressing Goal: Mental status will improve Outcome: Progressing Goal: Verbalization of understanding the information provided will improve Outcome: Progressing   Problem: Activity: Goal: Interest or engagement in activities will improve Outcome: Progressing   Problem: Coping: Goal: Ability to verbalize frustrations and anger appropriately will improve Outcome: Progressing   Problem: Safety: Goal: Periods of time without injury will increase Outcome: Progressing

## 2024-01-09 ENCOUNTER — Other Ambulatory Visit: Payer: Self-pay

## 2024-01-09 DIAGNOSIS — F4321 Adjustment disorder with depressed mood: Secondary | ICD-10-CM | POA: Diagnosis not present

## 2024-01-09 NOTE — Progress Notes (Signed)
   01/09/24 1045  Psych Admission Type (Psych Patients Only)  Admission Status Involuntary  Psychosocial Assessment  Patient Complaints None  Eye Contact Fair  Facial Expression Flat  Affect Appropriate to circumstance  Speech Logical/coherent  Interaction Assertive  Motor Activity Slow  Appearance/Hygiene Unremarkable  Behavior Characteristics Cooperative  Mood Pleasant  Thought Process  Coherency WDL  Content WDL  Delusions None reported or observed  Perception WDL  Hallucination None reported or observed  Judgment WDL  Confusion WDL  Danger to Self  Current suicidal ideation? Denies  Agreement Not to Harm Self Yes  Description of Agreement verbal  Danger to Others  Danger to Others None reported or observed   Patient discharged at this time with all belongings and discharge instruction. Patient was walked to the ED entrance to retrieve car.

## 2024-01-09 NOTE — Plan of Care (Signed)
  Problem: Education: Goal: Knowledge of [facility] Education information/materials will improve Outcome: Progressing   Problem: Education: Goal: Emotional status will improve Outcome: Progressing   Problem: Education: Goal: Mental status will improve Outcome: Progressing   

## 2024-01-09 NOTE — Progress Notes (Signed)
  [facility] Adult Case Management Discharge Plan :  Will you be returning to the same living situation after discharge:  Yes,  pt reports that he is returning home.  At discharge, do you have transportation home?: Yes,  pt reports that his vehicle is on campus Do you have the ability to pay for your medications: Yes,  BLUE CROSS BLUE SHIELD / BCBS OTHER  Release of information consent forms completed and in the chart;  Patient's signature needed at discharge.  Patient to Follow up at:  Follow-up Information     [facility] Follow up.   Why: Your appointment is scheduled for Friday, 01/13/24 at 10AM. Contact information: [address] [place] [address] [phone]                 Next level of care provider has access to [facility] Link:no  Safety Planning and Suicide Prevention discussed: Yes,  SPE completed with the patient.      Has patient been referred to the Quitline?: Yes, faxed/e-referral on 01/09/2024  Patient has been referred for addiction treatment: Patient refused referral for treatment.  Harden Mo, LCSW 01/09/2024, 11:06 AM

## 2024-01-09 NOTE — Discharge Summary (Signed)
 Physician Discharge Summary Note  Patient:  Stephen Ford is an 28 y.o., male MRN:  [id number] DOB:  June 06, 1996 Patient phone:  [phone] (home)  Patient address:   [address] [place] [address],    Date of Admission:  01/05/2024 Date of Discharge: 01/09/2024  Reason for Admission:  28 year old male with no previous psychiatric hx, hx of seizures as a child, marijuana abuse,and nicotine dependence presented to [facility] ED yesterday with is friend, with chief complaint of suicidal thoughts with a plan to drive his car into a tree.   Principal Problem: Adjustment disorder with depressed mood Discharge Diagnoses: Principal Problem:   Adjustment disorder with depressed mood   Past Psychiatric History: DX: denies  Outpatient provider: denies  Current caregiver:  Patient is own guardian/ care giver Past hospitalizations:  denies  Medication trials : denies  Suicide attempts: x3; lying on a railroad track 4 yrs ago  Self harm: via cutting, beginning at 28 yrs old, a few times over summer holiday, "I was able to feel something other than worthlessness I suppose"  Patient denies ever having an Act/CST team. Denies ECT, Clozaril treatments.  Past Medical History:  Past Medical History:  Diagnosis Date   Gastric ulcer    Headache(784.0)    Stomach ulcer    Substance abuse (HCC)     Past Surgical History:  Procedure Laterality Date   CRANIOTOMY     Family History:  Family History  Problem Relation Age of Onset   Other Other        Maternal Great Aunt had Down's Syndrome   Blindness Other        Maternal Programme researcher, broadcasting/film/video   Family Psychiatric  History:  mother, sisters, maternal grandmother - bipolar No hx of suicide  Maternal uncle - crack cocaine  Social History:  Social History   Substance and Sexual Activity  Alcohol Use No     Social History   Substance and Sexual Activity  Drug Use Yes   Types: Marijuana   Comment: daily    Social History    Socioeconomic History   Marital status: Single    Spouse name: Not on file   Number of children: 0   Years of education: Not on file   Highest education level: High school graduate  Occupational History   Not on file  Tobacco Use   Smoking status: Never   Smokeless tobacco: Never  Substance and Sexual Activity   Alcohol use: No   Drug use: Yes    Types: Marijuana    Comment: daily   Sexual activity: Not on file  Other Topics Concern   Not on file  Social History Narrative   ** Merged History Encounter **       Social Drivers of Health   Financial Resource Strain: Not on file  Food Insecurity: No Food Insecurity (01/05/2024)   Hunger Vital Sign    Worried About Running Out of Food in the Last Year: Never true    Ran Out of Food in the Last Year: Never true  Transportation Needs: No Transportation Needs (01/05/2024)   PRAPARE - Administrator, Civil Service (Medical): No    Lack of Transportation (Non-Medical): No  Physical Activity: Not on file  Stress: Not on file  Social Connections: Not on file    Hospital Course:  HOSPITAL COURSE: The patient was admitted to Inpatient psychiatric treatment for stabilization of SI. Patient was placed on suicidal precautions. The patient  was evaluated and treated by the multidisciplinary treatment team including physicians, nurses, social workers and therapists. All medications were presented to the patient and the Patient gave consent to all the medications that they were given, as well as was explained the risks, benefits, side effects and alternatives of all medication therapies. The patient was integrated into the general milieu on the ward and encouraged to attend to his ADLs and participate in all groups and activities. During hospital course the Patient attended coping skill groups, music therapy and activity therapy groups. Patient was counseled on cognitive techniques/skills by multiple staff members and given support  care by the staff.   Patient's medication regimen was evaluated and titrated to therapeutic levels to better Patient's overall daily functioning. Patient was initially seen by Paulene Floor, PA supervised by Dr. Irwin Brakeman. The  patient was started on Zoloft  He tolerated the medication well with no significant side effects. Stephanie tingling, PA had put discharge orders for this patient on 01/08/2024, to be discharged on 01/09/2024.  Today during assessment patient was pleasant to talk to. He denies any intention or plan of harming himself or others. He knowledges that he needs to work on coping strategies and abstaining from drug use. Patient was provided with support and reassurance. Patient felt safe going back home. Patient was encouraged to be compliant with treatment   During the hospitalization, the patient demonstrated a stabilization of mood and depression, with improved sleep and appetite. At the time of discharge, the patient denied any suicidal ideation/homicidal ideation and was not overtly depressed, manic or psychotic. The Patient was interacting well in groups and on the unit with their peers. Patient was able to identify a safety plan to include speaking with family, contacting outpatient provider or calling 911 if hallucinations/delusions returned or worsened or thoughts of self-harm or suicide return. Patient was counselled on outpatient follow-up that was arranged prior to discharge.  Musculoskeletal: Strength & Muscle Tone: within normal limits Gait & Station: normal Patient leans: N/A   Psychiatric Specialty Exam:   Presentation  General Appearance:  Appropriate for Environment   Eye Contact: Good   Speech: Clear and Coherent; Normal Rate   Speech Volume: Normal   Handedness:No data recorded   Mood and Affect  Mood: Euthymic   Affect: Appropriate     Thought Process  Thought Processes: Coherent; Goal Directed; Linear   Descriptions of  Associations:Intact   Orientation:Full (Time, Place and Person)   Thought Content:Logical   History of Schizophrenia/Schizoaffective disorder:No   Duration of Psychotic Symptoms:No data recorded Hallucinations:Hallucinations: None     Ideas of Reference:None   Suicidal Thoughts:Suicidal Thoughts: No     Homicidal Thoughts:Homicidal Thoughts: No       Sensorium  Memory: Immediate Good; Recent Good; Remote Good   Judgment: Intact   Insight: Good     Executive Functions  Concentration: Good   Attention Span: Good   Recall: Good   Fund of Knowledge: Good   Language: Good     Psychomotor Activity  Psychomotor Activity: Psychomotor Activity: Normal       Assets  Assets: Communication Skills; Desire for Improvement; Financial Resources/Insurance; Housing; Leisure Time; Physical Health; Social Support; Transportation     Sleep  Sleep: Sleep: Good         Physical Exam: Physical Exam Vitals and nursing note reviewed.  Constitutional:      Appearance: Normal appearance.  HENT:     Head: Normocephalic and atraumatic.  Eyes:  Extraocular Movements: Extraocular movements intact.  Pulmonary:     Effort: Pulmonary effort is normal.  Skin:    General: Skin is dry.  Neurological:     General: No focal deficit present.     Mental Status: He is alert and oriented to person, place, and time. Mental status is at baseline.  Psychiatric:        Attention and Perception: Attention and perception normal.        Mood and Affect: Mood and affect normal.        Speech: Speech normal.        Behavior: Behavior normal. Behavior is cooperative.        Thought Content: Thought content normal.        Cognition and Memory: Cognition and memory normal.        Judgment: Judgment normal.      Review of Systems  Psychiatric/Behavioral:  Positive for substance abuse.   All other systems reviewed and are negative.  Blood pressure 120/75, pulse 70,  temperature 97.6 F (36.4 C), resp. rate 16, height 5\' 9"  (1.753 m), weight 83 kg, SpO2 99%. Body mass index is 27.02 kg/m.   Social History   Tobacco Use  Smoking Status Never  Smokeless Tobacco Never   Tobacco Cessation:  N/A, patient does not currently use tobacco products   Blood Alcohol level:  Lab Results  Component Value Date   ETH <10 01/05/2024    Metabolic Disorder Labs:  Lab Results  Component Value Date   HGBA1C 5.3 01/07/2024   MPG 105.41 01/07/2024   No results found for: "PROLACTIN" Lab Results  Component Value Date   CHOL 197 01/07/2024   TRIG 49 01/07/2024   HDL 65 01/07/2024   CHOLHDL 3.0 01/07/2024   VLDL 10 01/07/2024   LDLCALC 122 (H) 01/07/2024    See Psychiatric Specialty Exam and Suicide Risk Assessment completed by Attending Physician prior to discharge.  Discharge destination:  Home   Recommended Plan for Multiple Antipsychotic Therapies: NA   Allergies as of 01/09/2024   No Known Allergies      Medication List     TAKE these medications      Indication  hydrOXYzine 25 MG tablet Commonly known as: ATARAX Take 1 tablet (25 mg total) by mouth 3 (three) times daily as needed for anxiety.  Indication: Feeling Anxious   nicotine polacrilex 2 MG gum Commonly known as: NICORETTE Take 1 each (2 mg total) by mouth as needed for smoking cessation.  Indication: Nicotine Addiction   sertraline 50 MG tablet Commonly known as: ZOLOFT Take 1 tablet (50 mg total) by mouth daily.  Indication: Generalized Anxiety Disorder, Major Depressive Disorder   traZODone 50 MG tablet Commonly known as: DESYREL Take 1 tablet (50 mg total) by mouth at bedtime as needed for sleep.  Indication: Trouble Sleeping        Follow-up Information     [facility].   Why: Your appointment is scheduled for Friday, 01/13/24 at 10AM. Contact information: [address] [place] [address] [phone]                  PATIENTS CONDITION AT DISCHARGE: Stable TOBACCO CESSATION SCREENING  Patient was screened and counselled on smoking cessation at time of discharge.  PRESCRIPTION ARE LOCATED: On Chart  DISCHARGE INSTRUCTIONS: Diet: Regular Activity: As tolerated Take medications as prescribed and not to make any changes without first consulting with the outpatient provider. Patient  was advised to avoid any illicit drugs or alcohol due to negative impact on physical and mental health.  Patient should keep all follow up appointments.  TIME SPENT ON DISCHARGE: Over 35 minutes were spent on this patient's discharge including a face-to-face encounter, patient counseling, and preparation of discharge materials.    Signed: Lewanda Rife, MD 01/09/2024, 11:14 AM

## 2024-01-09 NOTE — [facility] Suicide Risk Assessment (Signed)
 [facility] Discharge Suicide Risk Assessment   Principal Problem: Adjustment disorder with depressed mood Discharge Diagnoses: Principal Problem:   Adjustment disorder with depressed mood    Musculoskeletal: Strength & Muscle Tone: within normal limits Gait & Station: normal Patient leans: N/A  Psychiatric Specialty Exam  Presentation  General Appearance:  Appropriate for Environment  Eye Contact: Good  Speech: Clear and Coherent; Normal Rate  Speech Volume: Normal    Mood and Affect  Mood: " Good"  Affect: Appropriate Full range  Thought Process  Thought Processes: Coherent; Goal Directed; Linear  Descriptions of Associations:Intact  Orientation:Full (Time, Place and Person)  Thought Content:Logical  History of Schizophrenia/Schizoaffective disorder:No  Duration of Psychotic Symptoms:NA Hallucinations:Hallucinations: None  Ideas of Reference:None  Suicidal Thoughts:Suicidal Thoughts: No  Homicidal Thoughts:Homicidal Thoughts: No   Sensorium  Memory: Immediate Good; Recent Good; Remote Good  Judgment: Intact  Insight: Good   Executive Functions  Concentration: Good  Attention Span: Good  Recall: Good  Fund of Knowledge: Good  Language: Good   Psychomotor Activity  Psychomotor Activity: Psychomotor Activity: Normal   Assets  Assets: Communication Skills; Desire for Improvement; Financial Resources/Insurance; Housing; Leisure Time; Physical Health; Social Support; Transportation   Sleep  Sleep: Sleep: Good    Physical Exam: Physical Exam Vitals and nursing note reviewed.  Constitutional:      Appearance: Normal appearance.  HENT:     Head: Normocephalic and atraumatic.  Eyes:     Extraocular Movements: Extraocular movements intact.  Pulmonary:     Effort: Pulmonary effort is normal.  Skin:    General: Skin is dry.  Neurological:     General: No focal deficit present.     Mental Status: He is alert and  oriented to person, place, and time. Mental status is at baseline.  Psychiatric:        Attention and Perception: Attention and perception normal.        Mood and Affect: Mood and affect normal.        Speech: Speech normal.        Behavior: Behavior normal. Behavior is cooperative.        Thought Content: Thought content normal.        Cognition and Memory: Cognition and memory normal.        Judgment: Judgment normal.      Review of Systems  Psychiatric/Behavioral:  Positive for substance abuse.   All other systems reviewed and are negative.  Blood pressure 120/75, pulse 70, temperature 97.6 F (36.4 C), resp. rate 16, height 5\' 9"  (1.753 m), weight 83 kg, SpO2 99%. Body mass index is 27.02 kg/m.    Demographic Factors:  Male and Adolescent or young adult  Loss Factors: Financial problems/change in socioeconomic status  Historical Factors: Prior suicide attempts and Impulsivity  Risk Reduction Factors:   Sense of responsibility to family, Positive social support, Positive therapeutic relationship, and Positive coping skills or problem solving skills  Continued Clinical Symptoms:  Alcohol/Substance Abuse/Dependencies  Cognitive Features That Contribute To Risk:  Thought constriction (tunnel vision)    Suicide Risk:  Minimal: No identifiable suicidal ideation.  Patients presenting with no risk factors but with morbid ruminations; may be classified as minimal risk based on the severity of the depressive symptoms   Follow-up Information     [facility] Follow up.   Why: Your appointment is scheduled for Friday, 01/13/24 at 10AM. Contact information: [address] [place] [address] [phone]  Plan Of Care/Follow-up recommendations:  Per Discharge Summary  Lewanda Rife, MD 01/09/2024, 10:54 AM
